# Patient Record
Sex: Male | Born: 1985 | Race: White | Hispanic: No | Marital: Single | Smoking: Never smoker
Health system: Southern US, Community
[De-identification: ages and names within clinical notes are randomized; demographics above are authoritative.]

## PROBLEM LIST (undated history)

## (undated) DIAGNOSIS — K219 Gastro-esophageal reflux disease without esophagitis: Secondary | ICD-10-CM

## (undated) DIAGNOSIS — R51 Headache: Secondary | ICD-10-CM

## (undated) DIAGNOSIS — F419 Anxiety disorder, unspecified: Secondary | ICD-10-CM

## (undated) DIAGNOSIS — Z9289 Personal history of other medical treatment: Secondary | ICD-10-CM

## (undated) DIAGNOSIS — F32A Depression, unspecified: Secondary | ICD-10-CM

## (undated) DIAGNOSIS — R519 Headache, unspecified: Secondary | ICD-10-CM

## (undated) DIAGNOSIS — S064X9A Epidural hemorrhage with loss of consciousness of unspecified duration, initial encounter: Secondary | ICD-10-CM

## (undated) DIAGNOSIS — S0219XA Other fracture of base of skull, initial encounter for closed fracture: Secondary | ICD-10-CM

## (undated) DIAGNOSIS — S81012A Laceration without foreign body, left knee, initial encounter: Secondary | ICD-10-CM

## (undated) DIAGNOSIS — R569 Unspecified convulsions: Secondary | ICD-10-CM

## (undated) DIAGNOSIS — F329 Major depressive disorder, single episode, unspecified: Secondary | ICD-10-CM

## (undated) HISTORY — DX: Depression, unspecified: F32.A

## (undated) HISTORY — DX: Unspecified convulsions: R56.9

## (undated) HISTORY — DX: Gastro-esophageal reflux disease without esophagitis: K21.9

## (undated) HISTORY — PX: WISDOM TOOTH EXTRACTION: SHX21

## (undated) HISTORY — DX: Anxiety disorder, unspecified: F41.9

## (undated) HISTORY — PX: OTHER SURGICAL HISTORY: SHX169

## (undated) HISTORY — PX: HERNIA REPAIR: SHX51

---
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
## 2006-10-25 ENCOUNTER — Encounter (INDEPENDENT_AMBULATORY_CARE_PROVIDER_SITE_OTHER): Payer: Self-pay | Admitting: General Surgery

## 2006-10-25 ENCOUNTER — Observation Stay (HOSPITAL_COMMUNITY): Admission: RE | Admit: 2006-10-25 | Discharge: 2006-10-26 | Payer: Self-pay | Admitting: General Surgery

## 2009-12-19 ENCOUNTER — Emergency Department (HOSPITAL_COMMUNITY): Admission: EM | Admit: 2009-12-19 | Discharge: 2009-12-19 | Payer: Self-pay | Admitting: Emergency Medicine

## 2009-12-21 ENCOUNTER — Ambulatory Visit (HOSPITAL_COMMUNITY): Admission: RE | Admit: 2009-12-21 | Discharge: 2009-12-21 | Payer: Self-pay | Admitting: Family Medicine

## 2010-01-01 ENCOUNTER — Emergency Department (HOSPITAL_COMMUNITY): Admission: EM | Admit: 2010-01-01 | Discharge: 2010-01-02 | Payer: Self-pay | Admitting: Emergency Medicine

## 2010-01-05 ENCOUNTER — Emergency Department (HOSPITAL_COMMUNITY): Admission: EM | Admit: 2010-01-05 | Discharge: 2010-01-05 | Payer: Self-pay | Admitting: Emergency Medicine

## 2010-02-11 ENCOUNTER — Emergency Department (HOSPITAL_COMMUNITY)
Admission: EM | Admit: 2010-02-11 | Discharge: 2010-02-11 | Payer: Self-pay | Source: Home / Self Care | Admitting: Emergency Medicine

## 2010-02-17 ENCOUNTER — Emergency Department (HOSPITAL_COMMUNITY): Admission: EM | Admit: 2010-02-17 | Discharge: 2010-02-17 | Payer: Self-pay | Admitting: Emergency Medicine

## 2010-02-21 ENCOUNTER — Emergency Department (HOSPITAL_COMMUNITY): Admission: EM | Admit: 2010-02-21 | Discharge: 2010-02-21 | Payer: Self-pay | Admitting: Emergency Medicine

## 2010-02-23 ENCOUNTER — Emergency Department (HOSPITAL_COMMUNITY): Admission: EM | Admit: 2010-02-23 | Discharge: 2010-02-23 | Payer: Self-pay | Admitting: Emergency Medicine

## 2010-03-17 ENCOUNTER — Emergency Department (HOSPITAL_COMMUNITY)
Admission: EM | Admit: 2010-03-17 | Discharge: 2010-03-17 | Payer: Self-pay | Source: Home / Self Care | Admitting: Emergency Medicine

## 2010-05-23 ENCOUNTER — Emergency Department (HOSPITAL_COMMUNITY)
Admission: EM | Admit: 2010-05-23 | Discharge: 2010-05-23 | Disposition: A | Discharge status: Home / Self Care | Payer: PRIVATE HEALTH INSURANCE | Attending: Emergency Medicine | Admitting: Emergency Medicine

## 2010-05-23 ENCOUNTER — Emergency Department (HOSPITAL_COMMUNITY): Payer: PRIVATE HEALTH INSURANCE

## 2010-05-23 DIAGNOSIS — M545 Low back pain, unspecified: Secondary | ICD-10-CM | POA: Insufficient documentation

## 2010-05-23 DIAGNOSIS — M25529 Pain in unspecified elbow: Secondary | ICD-10-CM | POA: Insufficient documentation

## 2010-05-23 DIAGNOSIS — IMO0002 Reserved for concepts with insufficient information to code with codable children: Secondary | ICD-10-CM | POA: Insufficient documentation

## 2010-06-04 ENCOUNTER — Emergency Department (HOSPITAL_COMMUNITY)
Admission: EM | Admit: 2010-06-04 | Discharge: 2010-06-04 | Disposition: A | Discharge status: Home / Self Care | Payer: PRIVATE HEALTH INSURANCE | Attending: Emergency Medicine | Admitting: Emergency Medicine

## 2010-06-04 DIAGNOSIS — IMO0002 Reserved for concepts with insufficient information to code with codable children: Secondary | ICD-10-CM | POA: Insufficient documentation

## 2010-06-04 DIAGNOSIS — M25529 Pain in unspecified elbow: Secondary | ICD-10-CM | POA: Insufficient documentation

## 2010-06-04 DIAGNOSIS — Y929 Unspecified place or not applicable: Secondary | ICD-10-CM | POA: Insufficient documentation

## 2010-06-04 DIAGNOSIS — X58XXXA Exposure to other specified factors, initial encounter: Secondary | ICD-10-CM | POA: Insufficient documentation

## 2010-06-08 LAB — URINALYSIS, ROUTINE W REFLEX MICROSCOPIC
Bilirubin Urine: NEGATIVE
Hgb urine dipstick: NEGATIVE
Nitrite: NEGATIVE
Specific Gravity, Urine: 1.025 (ref 1.005–1.030)
pH: 5.5 (ref 5.0–8.0)

## 2010-06-10 LAB — COMPREHENSIVE METABOLIC PANEL
ALT: 37 U/L (ref 0–53)
AST: 26 U/L (ref 0–37)
Alkaline Phosphatase: 55 U/L (ref 39–117)
CO2: 30 mEq/L (ref 19–32)
Chloride: 99 mEq/L (ref 96–112)
Creatinine, Ser: 0.88 mg/dL (ref 0.4–1.5)
GFR calc Af Amer: >60 mL/min (ref >60)
GFR calc non Af Amer: >60 mL/min (ref >60)
Potassium: 3.6 mEq/L (ref 3.5–5.1)
Total Bilirubin: 0.4 mg/dL (ref 0.3–1.2)

## 2010-06-10 LAB — DIFFERENTIAL
Basophils Absolute: 0.1 10*3/uL (ref 0.0–0.1)
Basophils Relative: 1 % (ref 0–1)
Eosinophils Absolute: 0.6 10*3/uL (ref 0.0–0.7)
Eosinophils Relative: 15 % — ABNORMAL HIGH (ref 0–5)

## 2010-06-10 LAB — CBC
Hemoglobin: 13.8 g/dL (ref 13.0–17.0)
MCH: 30.6 pg (ref 26.0–34.0)
RBC: 4.5 MIL/uL (ref 4.22–5.81)

## 2010-06-10 LAB — URINALYSIS, ROUTINE W REFLEX MICROSCOPIC
Bilirubin Urine: NEGATIVE
Ketones, ur: NEGATIVE mg/dL
Nitrite: NEGATIVE
Protein, ur: NEGATIVE mg/dL

## 2010-06-20 ENCOUNTER — Emergency Department (HOSPITAL_COMMUNITY): Payer: PRIVATE HEALTH INSURANCE

## 2010-06-20 ENCOUNTER — Emergency Department (HOSPITAL_COMMUNITY)
Admission: EM | Admit: 2010-06-20 | Discharge: 2010-06-20 | Disposition: A | Discharge status: Home / Self Care | Payer: PRIVATE HEALTH INSURANCE | Attending: Emergency Medicine | Admitting: Emergency Medicine

## 2010-06-20 DIAGNOSIS — S60219A Contusion of unspecified wrist, initial encounter: Secondary | ICD-10-CM | POA: Insufficient documentation

## 2010-06-20 DIAGNOSIS — W138XXA Fall from, out of or through other building or structure, initial encounter: Secondary | ICD-10-CM | POA: Insufficient documentation

## 2010-06-20 DIAGNOSIS — S5010XA Contusion of unspecified forearm, initial encounter: Secondary | ICD-10-CM | POA: Insufficient documentation

## 2010-06-20 DIAGNOSIS — Y92009 Unspecified place in unspecified non-institutional (private) residence as the place of occurrence of the external cause: Secondary | ICD-10-CM | POA: Insufficient documentation

## 2010-06-25 ENCOUNTER — Emergency Department (HOSPITAL_COMMUNITY)
Admission: EM | Admit: 2010-06-25 | Discharge: 2010-06-25 | Disposition: A | Discharge status: Home / Self Care | Payer: PRIVATE HEALTH INSURANCE | Attending: Emergency Medicine | Admitting: Emergency Medicine

## 2010-06-25 DIAGNOSIS — M79609 Pain in unspecified limb: Secondary | ICD-10-CM | POA: Insufficient documentation

## 2010-06-25 DIAGNOSIS — Z09 Encounter for follow-up examination after completed treatment for conditions other than malignant neoplasm: Secondary | ICD-10-CM | POA: Insufficient documentation

## 2010-06-29 ENCOUNTER — Emergency Department (HOSPITAL_COMMUNITY): Payer: PRIVATE HEALTH INSURANCE

## 2010-06-29 ENCOUNTER — Emergency Department (HOSPITAL_COMMUNITY)
Admission: EM | Admit: 2010-06-29 | Discharge: 2010-06-29 | Disposition: A | Discharge status: Home / Self Care | Payer: PRIVATE HEALTH INSURANCE | Attending: Emergency Medicine | Admitting: Emergency Medicine

## 2010-06-29 DIAGNOSIS — Y92009 Unspecified place in unspecified non-institutional (private) residence as the place of occurrence of the external cause: Secondary | ICD-10-CM | POA: Insufficient documentation

## 2010-06-29 DIAGNOSIS — X500XXA Overexertion from strenuous movement or load, initial encounter: Secondary | ICD-10-CM | POA: Insufficient documentation

## 2010-06-29 DIAGNOSIS — M79609 Pain in unspecified limb: Secondary | ICD-10-CM | POA: Insufficient documentation

## 2010-08-10 NOTE — Op Note (Signed)
Marcus Mora, Marcus Mora              ACCOUNT NO.:  [id number]   MEDICAL RECORD NO.:  [id number]          PATIENT TYPE:  OBV   LOCATION:  A309                          FACILITY:  [facility]   PHYSICIAN:  Barbaraann Barthel, M.D. DATE OF BIRTH:  06/19/1985   DATE OF PROCEDURE:  10/25/2006  DATE OF DISCHARGE:                               OPERATIVE REPORT   SURGEON:  Barbaraann Barthel, M.D.   PREOPERATIVE DIAGNOSIS:  Left inguinal hernia (direct and indirect).   POSTOPERATIVE DIAGNOSIS:  Left inguinal hernia (direct and indirect).   PROCEDURE:  Left left inguinal hernia repair (modified McVay).   SPECIMEN:  Hernia sac and properitoneal lipomatous tissue.   This is a 20-year white male who had a left inguinal hernia,  nonincarcerated, with plans for an elective procedure.  We discussed  repair with him, discussed the possibility that mesh might be placed,  and discussed other complications including bleeding, infection and  recurrence.  Informed consent was obtained.   GROSS OPERATIVE FINDINGS:  Indirect hernia sac and direct defect.  Otherwise, the tissues appeared to be in good shape and I did not feel  that the use of mesh was needed.   TECHNIQUE:  The patient was placed in the supine position after the  adequate administration of general anesthesia.  Foley catheter prior was  aseptically inserted.  We prepped the groin area with Betadine solution  and draped in the usual manner.  An incision was carried out between the  anterior-superior iliac spine and the pubic tubercle through skin,  subcutaneous tissue, down through the Scarpa's layer and down through  the external oblique which we opened through the external ring.  The  cord structures were then dissected free from the hernia sac which was  ligated and divided with 2-0 Bralon. We then removed also small  lipomatous properitoneal fatty tissue around the proximal portion of the  cord structures.  We then sutured transversus abdominis  and  transversalis fascia to Cooper's ligament and Poupart's ligament using  interrupted Bralon sutures.  We cinched these tightly after performing a  relaxing incision.  After checking for hemostasis, we anesthetized the  area with approximately 6 or 7 mL of 0.5% Sensorcaine without  epinephrine and then returned the cord structures to their anatomic  position and closed the external oblique over it with a running 3-0  Polysorb suture.  Subcu was irrigated.  The skin was approximated with a  stapling device.  Sterile dressing was applied.  Prior to closure all  sponge, needle and instrument counts were found to be correct.  Estimated blood loss was minimal.  The patient tolerated the procedure  well and was taken to the recovery room in satisfactory condition.      Barbaraann Barthel, M.D.  Electronically Signed     WB/MEDQ  D:  10/25/2006  T:  10/26/2006  Job:  [id number]   cc:   Madelin Rear. Sherwood Gambler, MD  Fax: [phone]

## 2010-10-03 ENCOUNTER — Encounter: Payer: Self-pay | Admitting: *Deleted

## 2010-10-03 ENCOUNTER — Emergency Department (HOSPITAL_COMMUNITY): Payer: Self-pay

## 2010-10-03 ENCOUNTER — Emergency Department (HOSPITAL_COMMUNITY)
Admission: EM | Admit: 2010-10-03 | Discharge: 2010-10-03 | Disposition: A | Discharge status: Home / Self Care | Payer: Self-pay | Attending: Emergency Medicine | Admitting: Emergency Medicine

## 2010-10-03 DIAGNOSIS — S5012XA Contusion of left forearm, initial encounter: Secondary | ICD-10-CM

## 2010-10-03 DIAGNOSIS — S5010XA Contusion of unspecified forearm, initial encounter: Secondary | ICD-10-CM | POA: Insufficient documentation

## 2010-10-03 DIAGNOSIS — IMO0002 Reserved for concepts with insufficient information to code with codable children: Secondary | ICD-10-CM | POA: Insufficient documentation

## 2010-10-03 DIAGNOSIS — S60222A Contusion of left hand, initial encounter: Secondary | ICD-10-CM

## 2010-10-03 DIAGNOSIS — J45909 Unspecified asthma, uncomplicated: Secondary | ICD-10-CM | POA: Insufficient documentation

## 2010-10-03 DIAGNOSIS — S60229A Contusion of unspecified hand, initial encounter: Secondary | ICD-10-CM | POA: Insufficient documentation

## 2010-10-03 MED ORDER — IBUPROFEN 800 MG PO TABS
800.0000 mg | ORAL_TABLET | Freq: Once | ORAL | Status: AC
  Administered 2010-10-03: 800 mg via ORAL
  Filled 2010-10-03: qty 1

## 2010-10-03 MED ORDER — HYDROCODONE-ACETAMINOPHEN 5-325 MG PO TABS: 2.0000 | ORAL_TABLET | ORAL | Status: AC | PRN

## 2010-10-03 NOTE — ED Provider Notes (Signed)
History     Chief Complaint  Patient presents with  . Hand Injury  . Arm Injury   Patient is a 25 y.o. male presenting with arm injury. The history is provided by the patient.  Arm Injury  The incident occurred yesterday. Injury mechanism: He was working on a car when a heavy piece of metal rolled onto his left forearm. He came to the ER via personal transport. The pain is severe.  Pain is worse with palpation and with movement. Nothing makes it better.  Past Medical History  Diagnosis Date  . Asthma     Past Surgical History  Procedure Date  . Hernia repair     History reviewed. No pertinent family history.  History  Substance Use Topics  . Smoking status: Never Smoker   . Smokeless tobacco: Not on file  . Alcohol Use: No      Review of Systems  All other systems reviewed and are negative.    Physical Exam  BP 139/85  Pulse 78  Temp(Src) 98 F (36.7 C) (Oral)  Resp 16  Ht 5\' 11"  (1.803 m)  Wt 150 lb (68.04 kg)  BMI 20.92 kg/m2  SpO2 100%  Physical Exam  Constitutional: He is oriented to person, place, and time. He appears well-developed and well-nourished.  HENT:  Head: Normocephalic and atraumatic.  Right Ear: External ear normal.  Left Ear: External ear normal.  Mouth/Throat: Oropharynx is clear and moist.  Eyes: Conjunctivae and EOM are normal. Pupils are equal, round, and reactive to light.  Neck: Normal range of motion. Neck supple. JVD present.  Cardiovascular: Normal rate, regular rhythm and normal heart sounds.   No murmur heard. Pulmonary/Chest: Effort normal and breath sounds normal. He has no wheezes. He has no rales.  Abdominal: Soft. Bowel sounds are normal. He exhibits no mass. There is no tenderness.  Musculoskeletal: Normal range of motion. He exhibits no edema.  Lymphadenopathy:    He has no cervical adenopathy.  Neurological: He is alert and oriented to person, place, and time. He has normal reflexes. A cranial nerve deficit is  present. Coordination abnormal.  Skin: Skin is warm and dry. No rash noted.  Psychiatric: He has a normal mood and affect.    ED Course  ORTHOPEDIC INJURY TREATMENT Date/Time: 10/03/2010 11:09 AM Performed by: Dione Booze Authorized by: Preston Fleeting, Benett Swoyer Consent: Verbal consent obtained. Written consent not obtained. Risks and benefits: risks, benefits and alternatives were discussed Patient understanding: patient states understanding of the procedure being performed Patient consent: the patient's understanding of the procedure matches consent given Procedure consent: procedure consent matches procedure scheduled Test results: test results available and properly labeled Site marked: the operative site was not marked Imaging studies: imaging studies available Injury location: wrist Location details: left wrist Injury type: soft tissue Pre-procedure neurovascular assessment: neurovascularly intact Pre-procedure distal perfusion: normal Pre-procedure neurological function: normal Pre-procedure range of motion: normal Local anesthesia used: no Patient sedated: no Immobilization: splint Splint type: velcro wrist splint. Post-procedure neurovascular assessment: post-procedure neurovascularly intact Post-procedure distal perfusion: normal Post-procedure neurological function: normal Post-procedure range of motion: unchanged Patient tolerance: Patient tolerated the procedure well with no immediate complications.    MDM Soft tissue injury to left forearm, wrist and hand.      Dione Booze, MD 10/03/10 [phone]

## 2010-10-03 NOTE — ED Notes (Signed)
Transmission of car rolled onto left hand/arm yesterday.  Swelling/deformity noted in triage.  C/o pain to hand/forearm.

## 2010-10-13 ENCOUNTER — Emergency Department (HOSPITAL_COMMUNITY): Payer: Self-pay

## 2010-10-13 ENCOUNTER — Emergency Department (HOSPITAL_COMMUNITY)
Admission: EM | Admit: 2010-10-13 | Discharge: 2010-10-13 | Disposition: A | Discharge status: Home / Self Care | Payer: Self-pay | Attending: Emergency Medicine | Admitting: Emergency Medicine

## 2010-10-13 ENCOUNTER — Encounter (HOSPITAL_COMMUNITY): Payer: Self-pay | Admitting: *Deleted

## 2010-10-13 DIAGNOSIS — W19XXXA Unspecified fall, initial encounter: Secondary | ICD-10-CM | POA: Insufficient documentation

## 2010-10-13 DIAGNOSIS — S63509A Unspecified sprain of unspecified wrist, initial encounter: Secondary | ICD-10-CM | POA: Insufficient documentation

## 2010-10-13 DIAGNOSIS — M25539 Pain in unspecified wrist: Secondary | ICD-10-CM | POA: Insufficient documentation

## 2010-10-13 MED ORDER — HYDROCODONE-ACETAMINOPHEN 5-325 MG PO TABS: 1.0000 | ORAL_TABLET | ORAL | Status: DC | PRN

## 2010-10-13 MED ORDER — IBUPROFEN 800 MG PO TABS: 800.0000 mg | ORAL_TABLET | Freq: Three times a day (TID) | ORAL | Status: AC

## 2010-10-13 NOTE — ED Notes (Signed)
Pt states that he tripped and fell yesterday and caught himself with his left wrist. C/o pain since then. Strong radial pulse palpated. Left hand pink and warm. Able to wiggle fingers without difficulty. No deformity noted. Pt has on velcro wrist splint from previous injury.

## 2010-10-13 NOTE — ED Provider Notes (Addendum)
History     Chief Complaint  Patient presents with  . Wrist Pain   Patient is a 25 y.o. male presenting with wrist pain. The history is provided by the patient.  Wrist Pain This is a new (Patient fell yeterday,  landing on left outstretched hand.) problem. The current episode started yesterday. The problem occurs constantly. The problem has been unchanged. Associated symptoms include arthralgias. Pertinent negatives include no abdominal pain, chest pain, congestion, fever, headaches, joint swelling, nausea, neck pain, numbness, rash, sore throat or weakness. Exacerbated by: movement. He has tried acetaminophen (wrist splint and ice from injury to same wrist 10 days ago) for the symptoms. The treatment provided mild relief.  Wrist Pain This is a new (Patient fell yeterday,  landing on left outstretched hand.) problem. The current episode started yesterday. The problem occurs constantly. The problem has been unchanged. Pertinent negatives include no chest pain, no abdominal pain, no headaches and no shortness of breath. Exacerbated by: movement. He has tried acetaminophen (wrist splint and ice from injury to same wrist 10 days ago) for the symptoms. The treatment provided mild relief.    Past Medical History  Diagnosis Date  . Asthma     Past Surgical History  Procedure Date  . Hernia repair     Family History  Problem Relation Age of Onset  . Thyroid disease Mother     History  Substance Use Topics  . Smoking status: Never Smoker   . Smokeless tobacco: Not on file  . Alcohol Use: No      Review of Systems  Constitutional: Negative for fever.  HENT: Negative for congestion, sore throat and neck pain.   Eyes: Negative.   Respiratory: Negative for chest tightness and shortness of breath.   Cardiovascular: Negative for chest pain.  Gastrointestinal: Negative for nausea and abdominal pain.  Genitourinary: Negative.   Musculoskeletal: Positive for arthralgias. Negative for  joint swelling.  Skin: Positive for color change. Negative for rash and wound.  Neurological: Negative for dizziness, weakness, light-headedness, numbness and headaches.  Hematological: Negative.   Psychiatric/Behavioral: Negative.     Physical Exam  BP 119/81  Pulse 62  Temp(Src) 97.8 F (36.6 C) (Oral)  Resp 16  Ht 5\' 11"  (1.803 m)  Wt 150 lb (68.04 kg)  BMI 20.92 kg/m2  SpO2 100%  Physical Exam  Vitals reviewed. Constitutional: He is oriented to person, place, and time. He appears well-developed and well-nourished.  HENT:  Head: Normocephalic and atraumatic.  Eyes: Conjunctivae are normal.  Neck: Normal range of motion.  Cardiovascular: Normal rate and regular rhythm.   Pulmonary/Chest: Effort normal.  Musculoskeletal: He exhibits tenderness.       Left wrist: He exhibits decreased range of motion and bony tenderness. He exhibits no deformity and no laceration.  Neurological: He is alert and oriented to person, place, and time. He has normal strength. No sensory deficit.  Skin: Skin is warm and dry.     Psychiatric: He has a normal mood and affect.    ED Course  Procedures  MDM Left wrist sprain Medical screening examination/treatment/procedure(s) were performed by non-physician practitioner and as supervising physician I was immediately available for consultation/collaboration.      Candis Musa, PA 10/13/10 1017  Benny Lennert, MD 11/17/10 [phone]

## 2010-10-15 ENCOUNTER — Encounter (HOSPITAL_COMMUNITY): Payer: Self-pay

## 2010-10-15 ENCOUNTER — Emergency Department (HOSPITAL_COMMUNITY): Payer: Self-pay

## 2010-10-15 ENCOUNTER — Emergency Department (HOSPITAL_COMMUNITY)
Admission: EM | Admit: 2010-10-15 | Discharge: 2010-10-15 | Disposition: A | Discharge status: Home / Self Care | Payer: Self-pay | Attending: Emergency Medicine | Admitting: Emergency Medicine

## 2010-10-15 DIAGNOSIS — J45909 Unspecified asthma, uncomplicated: Secondary | ICD-10-CM | POA: Insufficient documentation

## 2010-10-15 DIAGNOSIS — S81009A Unspecified open wound, unspecified knee, initial encounter: Secondary | ICD-10-CM | POA: Insufficient documentation

## 2010-10-15 DIAGNOSIS — S81819A Laceration without foreign body, unspecified lower leg, initial encounter: Secondary | ICD-10-CM

## 2010-10-15 DIAGNOSIS — S81809A Unspecified open wound, unspecified lower leg, initial encounter: Secondary | ICD-10-CM | POA: Insufficient documentation

## 2010-10-15 DIAGNOSIS — W268XXA Contact with other sharp object(s), not elsewhere classified, initial encounter: Secondary | ICD-10-CM | POA: Insufficient documentation

## 2010-10-15 MED ORDER — OXYCODONE HCL 5 MG PO TABS
5.0000 mg | ORAL_TABLET | Freq: Once | ORAL | Status: AC
  Administered 2010-10-15: 5 mg via ORAL
  Filled 2010-10-15: qty 1

## 2010-10-15 MED ORDER — IBUPROFEN 800 MG PO TABS: 800.0000 mg | ORAL_TABLET | Freq: Three times a day (TID) | ORAL | Status: AC

## 2010-10-15 MED ORDER — OXYCODONE-ACETAMINOPHEN 5-325 MG PO TABS: 1.0000 | ORAL_TABLET | Freq: Once | ORAL | Status: DC

## 2010-10-15 MED ORDER — DIPHTHERIA-TETANUS TOXOIDS 6.7-5 LFU/0.5ML IM INJ: 0.5000 mL | INJECTION | Freq: Once | INTRAMUSCULAR | Status: DC

## 2010-10-15 MED ORDER — OXYCODONE HCL 5 MG PO TABS: 5.0000 mg | ORAL_TABLET | ORAL | Status: AC | PRN

## 2010-10-15 MED ORDER — LIDOCAINE-EPINEPHRINE 2 %-1:100000 IJ SOLN: 20.0000 mL | Freq: Once | INTRAMUSCULAR | Status: DC

## 2010-10-15 MED ORDER — TETANUS-DIPHTH-ACELL PERTUSSIS 5-2.5-18.5 LF-MCG/0.5 IM SUSP
0.5000 mL | Freq: Once | INTRAMUSCULAR | Status: AC
  Administered 2010-10-15: 0.5 mL via INTRAMUSCULAR
  Filled 2010-10-15: qty 0.5

## 2010-10-15 MED ORDER — HYDROCODONE-ACETAMINOPHEN 5-325 MG PO TABS: 1.0000 | ORAL_TABLET | ORAL | Status: AC | PRN

## 2010-10-15 NOTE — ED Notes (Signed)
Pt to xray

## 2010-10-15 NOTE — ED Notes (Signed)
Pt was carrying a piece of glass today and it slipped from his hands.  The glass fell onto his left shin.  Pt has a laceration to his left shin.  Bleeding is controlled.  nad noted

## 2010-10-15 NOTE — ED Notes (Signed)
Pt presents with laceration to right shin. Pt state piece of glass fell and cut leg open approx 3 inches.

## 2010-10-15 NOTE — ED Provider Notes (Signed)
History     Chief Complaint  Patient presents with  . Extremity Laceration   Patient is a 25 y.o. male presenting with skin laceration. The history is provided by the patient.  Laceration  The incident occurred 1 to 2 hours ago. The laceration is located on the right leg. The laceration is 5 cm in size. The laceration mechanism was a broken glass. The pain is at a severity of 4/10. The pain is moderate. The pain has been constant since onset. He reports no foreign bodies present. His tetanus status is out of date.    Past Medical History  Diagnosis Date  . Asthma     Past Surgical History  Procedure Date  . Hernia repair   . Hernia     Family History  Problem Relation Age of Onset  . Thyroid disease Mother     History  Substance Use Topics  . Smoking status: Never Smoker   . Smokeless tobacco: Not on file  . Alcohol Use: No      Review of Systems  Constitutional: Negative for fever.  HENT: Negative for congestion, sore throat and neck pain.   Eyes: Negative.   Respiratory: Negative for chest tightness and shortness of breath.   Cardiovascular: Negative for chest pain.  Gastrointestinal: Negative for nausea and abdominal pain.  Genitourinary: Negative.   Musculoskeletal: Negative for joint swelling and arthralgias.  Skin: Negative for rash and wound.       Otherwise negative.  Neurological: Negative for dizziness, weakness, light-headedness, numbness and headaches.  Hematological: Negative.   Psychiatric/Behavioral: Negative.     Physical Exam  BP 125/110  Pulse 94  Temp(Src) 98 F (36.7 C) (Oral)  Resp 24  Ht 5\' 11"  (1.803 m)  Wt 150 lb (68.04 kg)  BMI 20.92 kg/m2  SpO2 99%  Physical Exam  Vitals reviewed. Constitutional: He is oriented to person, place, and time. He appears well-developed and well-nourished.  HENT:  Head: Normocephalic and atraumatic.  Eyes: Conjunctivae are normal.  Neck: Normal range of motion.  Cardiovascular: Normal rate,  regular rhythm, normal heart sounds and intact distal pulses.   Pulmonary/Chest: Effort normal and breath sounds normal. He has no wheezes.  Abdominal: Soft. Bowel sounds are normal. There is no tenderness.  Musculoskeletal: Normal range of motion.  Neurological: He is alert and oriented to person, place, and time.  Skin: Skin is warm and dry.     Psychiatric: He has a normal mood and affect.    ED Course  LACERATION REPAIR Date/Time: 10/15/2010 4:01 PM Performed by: Javonnie Illescas L Authorized by: Benny Lennert Consent: Verbal consent obtained. Risks and benefits: risks, benefits and alternatives were discussed Consent given by: patient Patient understanding: patient states understanding of the procedure being performed Body area: lower extremity Location details: right lower leg Laceration length: 5 cm Foreign bodies: no foreign bodies Tendon involvement: none Nerve involvement: none Anesthesia: local infiltration Local anesthetic: lidocaine 1% with epinephrine Anesthetic total: 6 ml Patient sedated: no Irrigation method: syringe Amount of cleaning: extensive Debridement: none Skin closure: 4-0 nylon Number of sutures: 10 Technique: simple Approximation: close Approximation difficulty: simple Patient tolerance: Patient tolerated the procedure well with no immediate complications.    MDM       Candis Musa, PA 10/15/10 [phone]

## 2010-10-17 ENCOUNTER — Encounter (HOSPITAL_COMMUNITY): Payer: Self-pay

## 2010-10-17 ENCOUNTER — Emergency Department (HOSPITAL_COMMUNITY)
Admission: EM | Admit: 2010-10-17 | Discharge: 2010-10-17 | Disposition: A | Discharge status: Home / Self Care | Payer: Self-pay | Attending: Emergency Medicine | Admitting: Emergency Medicine

## 2010-10-17 DIAGNOSIS — S81009A Unspecified open wound, unspecified knee, initial encounter: Secondary | ICD-10-CM | POA: Insufficient documentation

## 2010-10-17 DIAGNOSIS — X58XXXA Exposure to other specified factors, initial encounter: Secondary | ICD-10-CM | POA: Insufficient documentation

## 2010-10-17 DIAGNOSIS — S81819A Laceration without foreign body, unspecified lower leg, initial encounter: Secondary | ICD-10-CM

## 2010-10-17 DIAGNOSIS — J45909 Unspecified asthma, uncomplicated: Secondary | ICD-10-CM | POA: Insufficient documentation

## 2010-10-17 MED ORDER — OXYCODONE HCL 5 MG PO TABS: ORAL_TABLET | ORAL | Status: DC

## 2010-10-17 NOTE — ED Provider Notes (Signed)
History     Chief Complaint  Patient presents with  . Wound Dehiscence   The history is provided by the patient (pt had R lower leg lac repaired 2 days ago.  awakened today and 3 central sutures are gone.  ). No language interpreter was used.    Past Medical History  Diagnosis Date  . Asthma     Past Surgical History  Procedure Date  . Hernia repair   . Hernia     Family History  Problem Relation Age of Onset  . Thyroid disease Mother     History  Substance Use Topics  . Smoking status: Never Smoker   . Smokeless tobacco: Not on file  . Alcohol Use: No      Review of Systems  Skin: Positive for wound.  All other systems reviewed and are negative.    Physical Exam  BP 137/62  Pulse 61  Temp(Src) 98.1 F (36.7 C) (Oral)  Resp 16  SpO2 100%  Physical Exam  Nursing note and vitals reviewed. Constitutional: He is oriented to person, place, and time. Vital signs are normal. He appears well-developed and well-nourished.  HENT:  Head: Normocephalic and atraumatic.  Right Ear: External ear normal.  Left Ear: External ear normal.  Nose: Nose normal.  Mouth/Throat: No oropharyngeal exudate.  Eyes: Conjunctivae and EOM are normal. Pupils are equal, round, and reactive to light. Right eye exhibits no discharge. Left eye exhibits no discharge. No scleral icterus.  Neck: Normal range of motion. Neck supple. No JVD present. No tracheal deviation present. No thyromegaly present.  Cardiovascular: Normal rate, regular rhythm, normal heart sounds, intact distal pulses and normal pulses.  Exam reveals no gallop and no friction rub.   No murmur heard. Pulmonary/Chest: Effort normal and breath sounds normal. No stridor. No respiratory distress. He has no wheezes. He has no rales. He exhibits no tenderness.  Abdominal: Soft. Normal appearance and bowel sounds are normal. He exhibits no distension and no mass. There is no tenderness. There is no rebound and no guarding.    Musculoskeletal: Normal range of motion. He exhibits no edema and no tenderness.       Legs: Lymphadenopathy:    He has no cervical adenopathy.  Neurological: He is alert and oriented to person, place, and time. He has normal reflexes. No cranial nerve deficit. Coordination normal. GCS eye subscore is 4. GCS verbal subscore is 5. GCS motor subscore is 6.  Reflex Scores:      Tricep reflexes are 2+ on the right side and 2+ on the left side.      Bicep reflexes are 2+ on the right side and 2+ on the left side.      Brachioradialis reflexes are 2+ on the right side and 2+ on the left side.      Patellar reflexes are 2+ on the right side and 2+ on the left side.      Achilles reflexes are 2+ on the right side and 2+ on the left side. Skin: Skin is warm and dry. No rash noted. He is not diaphoretic.       laceration  Psychiatric: He has a normal mood and affect. His speech is normal and behavior is normal. Judgment and thought content normal. Cognition and memory are normal.    ED Course  Procedures  MDM       Worthy Rancher, PA 10/17/10 1303

## 2010-10-17 NOTE — ED Notes (Signed)
Pt a/ox4. Resp even and unlabored. NAD at this time. D/c instructions reviewed with pt. Pt verbalized understanding. Pt escorted to d/c desk. Pt ambulated with steady gate. 

## 2010-10-17 NOTE — ED Notes (Signed)
Pt states 3 of his stitches came out of his right lower leg

## 2010-11-17 NOTE — ED Provider Notes (Signed)
Medical screening examination/treatment/procedure(s) were performed by non-physician practitioner and as supervising physician I was immediately available for consultation/collaboration.   Benny Lennert, MD 11/17/10 [phone]

## 2010-11-17 NOTE — ED Provider Notes (Signed)
Medical screening examination/treatment/procedure(s) were performed by non-physician practitioner and as supervising physician I was immediately available for consultation/collaboration.   Benny Lennert, MD 11/17/10 [phone]

## 2010-11-18 NOTE — ED Provider Notes (Signed)
Medical screening examination/treatment/procedure(s) were performed by non-physician practitioner and as supervising physician I was immediately available for consultation/collaboration.   Benny Lennert, MD 11/18/10 [phone]

## 2010-12-15 ENCOUNTER — Emergency Department (HOSPITAL_COMMUNITY): Payer: Self-pay

## 2010-12-15 ENCOUNTER — Ambulatory Visit (HOSPITAL_COMMUNITY): Payer: Self-pay

## 2010-12-15 ENCOUNTER — Encounter (HOSPITAL_COMMUNITY): Payer: Self-pay | Admitting: *Deleted

## 2010-12-15 ENCOUNTER — Emergency Department (HOSPITAL_COMMUNITY)
Admission: EM | Admit: 2010-12-15 | Discharge: 2010-12-15 | Disposition: A | Discharge status: Home / Self Care | Payer: Self-pay | Attending: Emergency Medicine | Admitting: Emergency Medicine

## 2010-12-15 DIAGNOSIS — M25569 Pain in unspecified knee: Secondary | ICD-10-CM | POA: Insufficient documentation

## 2010-12-15 DIAGNOSIS — M545 Low back pain, unspecified: Secondary | ICD-10-CM | POA: Insufficient documentation

## 2010-12-15 DIAGNOSIS — J45909 Unspecified asthma, uncomplicated: Secondary | ICD-10-CM | POA: Insufficient documentation

## 2010-12-15 DIAGNOSIS — Z9181 History of falling: Secondary | ICD-10-CM

## 2010-12-15 MED ORDER — IBUPROFEN 800 MG PO TABS
800.0000 mg | ORAL_TABLET | Freq: Once | ORAL | Status: AC
  Administered 2010-12-15: 800 mg via ORAL
  Filled 2010-12-15: qty 1

## 2010-12-15 NOTE — ED Provider Notes (Addendum)
History     CSN: [id number] Arrival date & time: 12/15/2010  3:56 AM   Chief Complaint  Patient presents with  . Back Pain  . Knee Pain     (Include location/radiation/quality/duration/timing/severity/associated sxs/prior treatment) Patient is a 25 y.o. male presenting with fall. The history is provided by the patient.  Fall Incident onset: Patient fell off a ladder on 11/15/10, hitting his  right knee on a rung as he fell. Was seen by PCP at the time and given pain medicine. Continues to have both lower back and right knee pain. The fall occurred from a ladder (11/15/10. Patient fell from 4 th rung from the bottom.). He fell from a height of 6 to 10 ft. He landed on dirt. There was no blood loss. Point of impact: back. Pain location: lower back and right knee. The pain is at a severity of 6/10. The pain is moderate. He was ambulatory at the scene. There was no entrapment after the fall. There was no drug use involved in the accident. There was no alcohol use involved in the accident. Associated symptoms include visual change. Pertinent negatives include no numbness, no nausea, no headaches and no loss of consciousness. Treatments tried: given "pain medicine" by PCP. The treatment provided no relief.     Past Medical History  Diagnosis Date  . Asthma      Past Surgical History  Procedure Date  . Hernia repair   . Hernia     Family History  Problem Relation Age of Onset  . Thyroid disease Mother     History  Substance Use Topics  . Smoking status: Never Smoker   . Smokeless tobacco: Not on file  . Alcohol Use: No      Review of Systems  Gastrointestinal: Negative for nausea.  Neurological: Negative for loss of consciousness, numbness and headaches.  All other systems reviewed and are negative.    Allergies  Tylenol  Home Medications   Current Outpatient Rx  Name Route Sig Dispense Refill  . OXYCODONE HCL 5 MG PO TABS  One po  q 4-6 hrs prn pain 12 tablet 0     Physical Exam    BP 134/90  Pulse 102  Temp 97.4 F (36.3 C)  Resp 18  Ht 5\' 10"  (1.778 m)  Wt 145 lb (65.772 kg)  BMI 20.81 kg/m2  SpO2 100%  Physical Exam  Nursing note and vitals reviewed. Constitutional: He is oriented to person, place, and time. He appears well-developed and well-nourished.  HENT:  Head: Normocephalic and atraumatic.  Eyes: EOM are normal. Pupils are equal, round, and reactive to light.  Neck: Normal range of motion. Neck supple.  Cardiovascular: Normal rate, normal heart sounds and intact distal pulses.   Pulmonary/Chest: Effort normal and breath sounds normal.  Abdominal: Soft. There is no tenderness. There is no rebound and no guarding.  Musculoskeletal: Normal range of motion.       R knee with no swelling, crepitus, bruising. FROM, normal strength. Lower back with no pain with percussion to spine. Paraspinal muscle tenderness LS spine.  Neurological: He is alert and oriented to person, place, and time.  Skin: Skin is warm.    ED Course  Procedures  Patient s/p fall over a month ago (11/15/10) with continued back pain and knee pain. Given NSAID. Had ordered LS spine films which patient decided he did not want.Pt stable in ED with no significant deterioration in condition. Nicoletta Dress. Colon Branch, MD 12/15/10 [id number] MDM Reviewed:  nursing note and vitals     Nicoletta Dress. Colon Branch, MD 12/15/10 [phone]

## 2010-12-15 NOTE — ED Notes (Signed)
Pt states he fell off the 4th rung of a ladder on the 21st of august. Complaining of back & right knee pain. Was seen by pcp & given meds & instructions to take it easy. Pt advises no improvement.

## 2010-12-15 NOTE — ED Notes (Signed)
Patient refused xray. Stated he did not have insurance

## 2011-01-10 LAB — COMPREHENSIVE METABOLIC PANEL
ALT: 11
AST: 16
CO2: 29
Chloride: 106
GFR calc Af Amer: >60
GFR calc non Af Amer: >60
Sodium: 139
Total Bilirubin: 0.8

## 2011-01-10 LAB — CBC
RBC: 4.27
WBC: 4.2

## 2011-05-03 ENCOUNTER — Emergency Department (HOSPITAL_COMMUNITY)
Admission: EM | Admit: 2011-05-03 | Discharge: 2011-05-03 | Disposition: A | Discharge status: Home / Self Care | Payer: No Typology Code available for payment source | Attending: Emergency Medicine | Admitting: Emergency Medicine

## 2011-05-03 ENCOUNTER — Emergency Department (HOSPITAL_COMMUNITY): Payer: No Typology Code available for payment source

## 2011-05-03 ENCOUNTER — Encounter (HOSPITAL_COMMUNITY): Payer: Self-pay

## 2011-05-03 DIAGNOSIS — S9032XA Contusion of left foot, initial encounter: Secondary | ICD-10-CM

## 2011-05-03 DIAGNOSIS — J45909 Unspecified asthma, uncomplicated: Secondary | ICD-10-CM | POA: Insufficient documentation

## 2011-05-03 DIAGNOSIS — M7989 Other specified soft tissue disorders: Secondary | ICD-10-CM | POA: Insufficient documentation

## 2011-05-03 DIAGNOSIS — M79609 Pain in unspecified limb: Secondary | ICD-10-CM | POA: Insufficient documentation

## 2011-05-03 MED ORDER — OXYCODONE-ACETAMINOPHEN 5-325 MG PO TABS
1.0000 | ORAL_TABLET | Freq: Once | ORAL | Status: AC
  Administered 2011-05-03: 1 via ORAL
  Filled 2011-05-03: qty 1

## 2011-05-03 MED ORDER — OXYCODONE-ACETAMINOPHEN 5-325 MG PO TABS: ORAL_TABLET | ORAL | Status: DC

## 2011-05-03 NOTE — ED Notes (Signed)
Ice pack given, instructed to apply in increments

## 2011-05-03 NOTE — ED Notes (Signed)
Pt was moving a wooden filing cabinet this morning and it fell on pt's left foot. Top of foot swollen and bruised.  Foot warm to touch, pt says can wiggle toes with shoe on but not without.   Pedal pulse present.

## 2011-05-03 NOTE — ED Notes (Signed)
Pt c/o left foot pain,

## 2011-05-03 NOTE — ED Provider Notes (Signed)
History     CSN: [id number]  Arrival date & time 05/03/11  1731   First MD Initiated Contact with Patient 05/03/11 1759      Chief Complaint  Patient presents with  . Foot Pain    (Consider location/radiation/quality/duration/timing/severity/associated sxs/prior treatment) HPI Comments: Moving a file cabinet this AM and dropped it on his L foot.  Patient is a 26 y.o. male presenting with lower extremity pain. The history is provided by the patient. No language interpreter was used.  Foot Pain This is a new problem. The current episode started today. The problem occurs constantly. The problem has been gradually worsening. The symptoms are aggravated by walking and standing. He has tried nothing for the symptoms.    Past Medical History  Diagnosis Date  . Asthma     Past Surgical History  Procedure Date  . Hernia repair   . Hernia     Family History  Problem Relation Age of Onset  . Thyroid disease Mother     History  Substance Use Topics  . Smoking status: Never Smoker   . Smokeless tobacco: Not on file  . Alcohol Use: No      Review of Systems  Musculoskeletal:       Foot injury   All other systems reviewed and are negative.    Allergies  Tylenol  Home Medications   Current Outpatient Rx  Name Route Sig Dispense Refill  . OXYCODONE HCL 5 MG PO TABS  One po  q 4-6 hrs prn pain 12 tablet 0    BP 134/85  Pulse 96  Temp(Src) 97.6 F (36.4 C) (Oral)  Resp 18  Ht 5\' 11"  (1.803 m)  Wt 150 lb (68.04 kg)  BMI 20.92 kg/m2  SpO2 98%  Physical Exam  Nursing note and vitals reviewed. Constitutional: He is oriented to person, place, and time. He appears well-developed and well-nourished.  HENT:  Head: Normocephalic and atraumatic.  Eyes: EOM are normal.  Neck: Normal range of motion.  Cardiovascular: Normal rate, regular rhythm, normal heart sounds and intact distal pulses.   Pulmonary/Chest: Effort normal and breath sounds normal. No respiratory  distress.  Abdominal: Soft. He exhibits no distension. There is no tenderness.  Musculoskeletal: He exhibits tenderness.       Left foot: He exhibits decreased range of motion, tenderness, bony tenderness and swelling. He exhibits normal capillary refill, no crepitus and no deformity.       Feet:  Neurological: He is alert and oriented to person, place, and time.  Skin: Skin is warm and dry.  Psychiatric: He has a normal mood and affect. Judgment normal.    ED Course  Procedures (including critical care time)  Labs Reviewed - No data to display Dg Foot Complete Left  05/03/2011  *RADIOLOGY REPORT*  Clinical Data: Pain.  Blunt injury  LEFT FOOT - COMPLETE 3+ VIEW  Comparison: None.  Findings: There is no evidence of fracture or dislocation.  There is no evidence of arthropathy or other focal bony abnormality. Mild dorsal soft tissue swelling.  IMPRESSION: No visible fracture or dislocation.  Original Report Authenticated By: Elsie Stain, M.D.     No diagnosis found.    MDM          Worthy Rancher, PA 05/03/11 [phone]

## 2011-05-03 NOTE — ED Provider Notes (Signed)
Medical screening examination/treatment/procedure(s) were performed by non-physician practitioner and as supervising physician I was immediately available for consultation/collaboration.   Benny Lennert, MD 05/03/11 2237

## 2011-09-03 ENCOUNTER — Encounter (HOSPITAL_COMMUNITY): Payer: Self-pay

## 2011-09-03 ENCOUNTER — Emergency Department (HOSPITAL_COMMUNITY): Payer: Self-pay

## 2011-09-03 ENCOUNTER — Emergency Department (HOSPITAL_COMMUNITY)
Admission: EM | Admit: 2011-09-03 | Discharge: 2011-09-03 | Disposition: A | Discharge status: Home / Self Care | Payer: Self-pay | Attending: Emergency Medicine | Admitting: Emergency Medicine

## 2011-09-03 DIAGNOSIS — S60229A Contusion of unspecified hand, initial encounter: Secondary | ICD-10-CM

## 2011-09-03 DIAGNOSIS — Y92009 Unspecified place in unspecified non-institutional (private) residence as the place of occurrence of the external cause: Secondary | ICD-10-CM | POA: Insufficient documentation

## 2011-09-03 DIAGNOSIS — S6990XA Unspecified injury of unspecified wrist, hand and finger(s), initial encounter: Secondary | ICD-10-CM | POA: Insufficient documentation

## 2011-09-03 DIAGNOSIS — Y9389 Activity, other specified: Secondary | ICD-10-CM | POA: Insufficient documentation

## 2011-09-03 DIAGNOSIS — Y998 Other external cause status: Secondary | ICD-10-CM | POA: Insufficient documentation

## 2011-09-03 DIAGNOSIS — W2209XA Striking against other stationary object, initial encounter: Secondary | ICD-10-CM | POA: Insufficient documentation

## 2011-09-03 MED ORDER — HYDROCODONE-ACETAMINOPHEN 5-325 MG PO TABS
1.0000 | ORAL_TABLET | Freq: Once | ORAL | Status: AC
  Administered 2011-09-03: 1 via ORAL
  Filled 2011-09-03: qty 1

## 2011-09-03 MED ORDER — OXYCODONE-ACETAMINOPHEN 5-325 MG PO TABS: 1.0000 | ORAL_TABLET | ORAL | Status: AC | PRN

## 2011-09-03 MED ORDER — IBUPROFEN 800 MG PO TABS: 800.0000 mg | ORAL_TABLET | Freq: Three times a day (TID) | ORAL | Status: AC

## 2011-09-03 MED ORDER — DIPHENHYDRAMINE HCL 25 MG PO CAPS
25.0000 mg | ORAL_CAPSULE | Freq: Once | ORAL | Status: AC
  Administered 2011-09-03: 25 mg via ORAL
  Filled 2011-09-03: qty 1

## 2011-09-03 MED ORDER — IBUPROFEN 800 MG PO TABS
800.0000 mg | ORAL_TABLET | Freq: Once | ORAL | Status: AC
  Administered 2011-09-03: 800 mg via ORAL
  Filled 2011-09-03: qty 1

## 2011-09-03 NOTE — ED Provider Notes (Signed)
History     CSN: [id number]  Arrival date & time 09/03/11  1352   First MD Initiated Contact with Patient 09/03/11 1405      Chief Complaint  Patient presents with  . Hand Injury    (Consider location/radiation/quality/duration/timing/severity/associated sxs/prior treatment) HPI Comments: Pain c/o pain to his left hand that began after punching on a punching bag and accidentally struck the metal stand.  Denies numbness or weakness of his hand or fingers.  Also denies wrist pain  Patient is a 26 y.o. male presenting with hand injury. The history is provided by the patient.  Hand Injury  The incident occurred yesterday. The incident occurred at home. The injury mechanism was a direct blow. The pain is present in the left hand. The quality of the pain is described as aching. The pain is moderate. The pain has been constant since the incident. Pertinent negatives include no fever and no malaise/fatigue. He reports no foreign bodies present. The symptoms are aggravated by movement, use and palpation. He has tried NSAIDs for the symptoms. The treatment provided no relief.    Past Medical History  Diagnosis Date  . Asthma     Past Surgical History  Procedure Date  . Hernia repair   . Hernia     Family History  Problem Relation Age of Onset  . Thyroid disease Mother     History  Substance Use Topics  . Smoking status: Never Smoker   . Smokeless tobacco: Not on file  . Alcohol Use: No      Review of Systems  Constitutional: Negative for fever, chills and malaise/fatigue.  Genitourinary: Negative for dysuria and difficulty urinating.  Musculoskeletal: Positive for joint swelling and arthralgias. Negative for back pain.  Skin: Negative for color change and wound.  Neurological: Negative for weakness and numbness.  All other systems reviewed and are negative.    Allergies  Hydrocodone  Home Medications  No current outpatient prescriptions on file.  BP 135/84  Pulse  65  Temp 97.7 F (36.5 C)  Resp 20  Ht 5\' 11"  (1.803 m)  Wt 150 lb (68.04 kg)  BMI 20.92 kg/m2  SpO2 100%  Physical Exam  Nursing note and vitals reviewed. Constitutional: He is oriented to person, place, and time. He appears well-developed and well-nourished. No distress.  HENT:  Head: Normocephalic and atraumatic.  Cardiovascular: Normal rate, regular rhythm and normal heart sounds.   Pulmonary/Chest: Effort normal and breath sounds normal.  Musculoskeletal: He exhibits tenderness. He exhibits no edema.       Left hand: He exhibits tenderness and swelling. He exhibits normal range of motion, no bony tenderness, normal two-point discrimination, normal capillary refill, no deformity and no laceration. normal sensation noted. Normal strength noted.       Hands:      ttp of the dorsal left hand, moderate STS present. No bony deformity.  Radial pulse is brisk, sensation intact.  CR< 2 sec.  No bruising or deformity.  Patient has full ROM.  Neurological: He is alert and oriented to person, place, and time. He exhibits normal muscle tone. Coordination normal.  Skin: Skin is warm and dry.    ED Course  Procedures (including critical care time)  Labs Reviewed - No data to display Dg Hand Complete Left  09/03/2011  *RADIOLOGY REPORT*  Clinical Data: Hand injury, swelling  LEFT HAND - COMPLETE 3+ VIEW  Comparison: 10/03/2010  Findings: Four views of the left hand submitted.  No acute fracture or  subluxation.  There is a soft tissue swelling metacarpal region.  IMPRESSION: No acute fracture or subluxation.  Soft tissue swelling metacarpal region.  Original Report Authenticated By: Natasha Mead, M.D.      Jones dressing applied for comfort.  Remains NV intact.  MDM      Previous medical charts, nursing notes and vitals signs from this visit were reviewed by me   All laboratory results and/or imaging results performed on this visit, if applicable, were reviewed by me and discussed with  the patient and/or parent as well as recommendation for follow-up    MEDICATIONS GIVEN IN ED:  Norco, ibuprofen, benadryl    PRESCRIPTIONS GIVEN AT DISCHARGE:  Percocet # 15, ibuprofen   Pt stable in ED with no significant deterioration in condition. Pt feels improved after observation and/or treatment in ED. Patient / Family / Caregiver understand and agree with initial ED impression and plan with expectations set for ED visit.  Patient agrees to return to ED for any worsening symptoms      Sira Adsit L. [place], [place] 09/07/11 1648

## 2011-09-03 NOTE — ED Notes (Signed)
Pain and swelling in left hand. 

## 2011-09-03 NOTE — Discharge Instructions (Signed)
Contusion (Bruise) of Hand  An injury to the hand may cause bruises (contusions). Contusions are caused by bleeding from small blood vessels (capillaries) that allow blood to leak out into the muscles, tendons, and surrounding soft tissue. This is followed by swelling and pain (inflammation). Contusions of the hand are common because of the use of hands in daily and recreational activities. Signs of a hand injury include pain, swelling, and a color change. Initially the skin may turn blue to purple in color. As the bruise ages, the color turns yellow and orange. Swelling may decrease the movement of the fingers. Contusions are seen more commonly with:   Contact sports (especially in football, wrestling, and basketball).   Use of medications that thin the blood (anticoagulants).   Use of aspirin and nonsteroidal anti-inflammatory agents that decrease the ability of the blood to clot.   Vitamin deficiencies.   Aging.  DIAGNOSIS   Diagnosis of hand injuries can be made by your own observation. If problems continue, a caregiver may be required for further evaluation and treatment. X-rays may be required to make sure there are no broken bones (fractures). Continued problems may require physical therapy for treatment.  RISKS AND COMPLICATIONS   Extensive bleeding and tissue inflammation. This can lead to disability and arthritis-type problems later on if the hand does not heal properly.   Infection of the hand if there are breaks in the skin. This is especially true if the hand injury came from someone's teeth, such as would occur with punching someone in the mouth. This can lead to an infection of the tendons and the membranes surrounding the tendons (sheaths). This infection can have severe complications including a loss of function (a "frozen" hand).   Rupture of the tendons requiring a surgical repair. Failure to repair the tendons can result in loss of function of the hand or fingers.  HOME CARE INSTRUCTIONS     Apply ice to the injury for 15 to 20 minutes, 3 to 4 times per day. Put the ice in a plastic bag and place a towel between the bag of ice and your skin.   An elastic bandage may be used initially for support and to minimize swelling. Do not wrap the hand too tightly. Do not sleep with the elastic bandage on.   Gentle massage from the fingertips towards the elbow will help keep the swelling down. Gently open and close your fist while doing this to maintain range of motion. Do this only after the first few days, when there is no or minimal pain.   Keep your hand above the level of the heart when swelling and pain are present. This will allow the fluid to drain out of the hand, decreasing the amount of swelling. This will improve healing time.   Try to avoid use of the injured hand (except for gentle range of motion) while the hand is hurting. Do not resume use until instructed by your caregiver. Then begin use gradually, do not increase use to the point of pain. If pain does develop, decrease use and continue the above measures, gradually increasing activities that do not cause discomfort until you achieve normal use.   Only take over-the-counter or prescription medicines for pain, discomfort, or fever as directed by your caregiver.   Follow up with your caregiver as directed. Follow-up care may include orthopedic referrals, physical therapy, and rehabilitation. Any delay in obtaining necessary care could result in delayed healing, or temporary or permanent disability.  REHABILITATION     Begin daily rehabilitation exercises when an elastic bandage is no longer needed and you are either pain free or only have minimal pain.   Use ice massage for 10 minutes before and after workouts. Put ice in a plastic bag and place a towel between the bag of ice and your skin. Massage the injured area with the ice pack.  SEEK IMMEDIATE MEDICAL CARE IF:    Your pain and swelling increase, or pain is uncontrolled with  medications.   You have loss of feeling in your hand, or your hand turns cold or blue.   An oral temperature above 102 F (38.9 C) develops, not controlled by medication.   Your hand becomes warm to the touch, or you have increased pain with even slight movement of your fingers.   Your hand does not begin to improve in 1 or 2 days.   The skin is broken and signs of infection occur (fluid draining from the contusion, increasing pain, fever, headache, muscle aches, dizziness, or a general ill feeling).   You develop new, unexplained problems, or an increase of the symptoms that brought you to your caregiver.  MAKE SURE YOU:    Understand these instructions.   Will watch your condition.   Will get help right away if you are not doing well or get worse.  Document Released: 09/03/2001 Document Revised: 03/03/2011 Document Reviewed: 08/21/2009  [employer] Patient Information 2012 [employer].

## 2011-09-07 NOTE — ED Provider Notes (Signed)
Medical screening examination/treatment/procedure(s) were performed by non-physician practitioner and as supervising physician I was immediately available for consultation/collaboration.  Donnetta Hutching, MD 09/07/11 2207

## 2011-10-15 ENCOUNTER — Encounter (HOSPITAL_COMMUNITY): Payer: Self-pay | Admitting: *Deleted

## 2011-10-15 ENCOUNTER — Emergency Department (HOSPITAL_COMMUNITY)
Admission: EM | Admit: 2011-10-15 | Discharge: 2011-10-15 | Disposition: A | Discharge status: Home / Self Care | Payer: Self-pay | Attending: Emergency Medicine | Admitting: Emergency Medicine

## 2011-10-15 DIAGNOSIS — K089 Disorder of teeth and supporting structures, unspecified: Secondary | ICD-10-CM | POA: Insufficient documentation

## 2011-10-15 DIAGNOSIS — K0889 Other specified disorders of teeth and supporting structures: Secondary | ICD-10-CM

## 2011-10-15 MED ORDER — OXYCODONE-ACETAMINOPHEN 5-325 MG PO TABS: 1.0000 | ORAL_TABLET | ORAL | Status: AC | PRN

## 2011-10-15 MED ORDER — OXYCODONE-ACETAMINOPHEN 5-325 MG PO TABS: 1.0000 | ORAL_TABLET | Freq: Once | ORAL | Status: DC

## 2011-10-15 MED ORDER — AMOXICILLIN 500 MG PO CAPS: 500.0000 mg | ORAL_CAPSULE | Freq: Three times a day (TID) | ORAL | Status: AC

## 2011-10-15 MED ORDER — AMOXICILLIN 250 MG PO CAPS: 500.0000 mg | ORAL_CAPSULE | Freq: Once | ORAL | Status: DC

## 2011-10-15 NOTE — ED Notes (Signed)
Pt's ride is in waiting room and has registered to be seen, meds not given due to pt's ride may receive pain meds as well and not able to verify ride

## 2011-10-15 NOTE — ED Notes (Signed)
Pt to get ride to verify that he has someone to drive him home prior to pain med given

## 2011-10-15 NOTE — ED Provider Notes (Signed)
History     CSN: [id number]  Arrival date & time 10/15/11  2211   First MD Initiated Contact with Patient 10/15/11 2233      Chief Complaint  Patient presents with  . Dental Pain    (Consider location/radiation/quality/duration/timing/severity/associated sxs/prior treatment) HPI Comments: Patient c/o pain to his left upper tooth that began 2 days ago when a piece of the tooth "chipped off" while eating hard candy.  C/o sharp pain to this tooth radiating into his face since that time.  No relief with tylenol or ibuprofen. Pain is worse with hot or cold liquids or food.  He denies, fever, bleeding of the gums, facial swelling or ear pain.   Patient is a 26 y.o. male presenting with tooth pain. The history is provided by the patient.  Dental PainThe primary symptoms include mouth pain. Primary symptoms do not include oral bleeding, headaches, fever, shortness of breath, sore throat or angioedema. The symptoms began 2 days ago. The symptoms are worsening. The symptoms are new. The symptoms occur constantly.  Mouth pain began 24 -48 hours ago. Mouth pain occurs constantly. Mouth pain is unchanged. Affected locations include: teeth.  Additional symptoms include: dental sensitivity to temperature. Additional symptoms do not include: gum swelling, gum tenderness, trismus, jaw pain, facial swelling, trouble swallowing, pain with swallowing and fatigue. Medical issues do not include: smoking and periodontal disease.    Past Medical History  Diagnosis Date  . Asthma     Past Surgical History  Procedure Date  . Hernia repair   . Hernia     Family History  Problem Relation Age of Onset  . Thyroid disease Mother     History  Substance Use Topics  . Smoking status: Never Smoker   . Smokeless tobacco: Not on file  . Alcohol Use: No      Review of Systems  Constitutional: Negative for fever, chills and fatigue.  HENT: Positive for dental problem. Negative for sore throat, facial  swelling, trouble swallowing, neck pain and neck stiffness.   Respiratory: Negative for shortness of breath.   Gastrointestinal: Negative for nausea and vomiting.  Skin: Negative for rash.  Neurological: Negative for dizziness, numbness and headaches.  Hematological: Does not bruise/bleed easily.  All other systems reviewed and are negative.    Allergies  Hydrocodone  Home Medications  No current outpatient prescriptions on file.  BP 144/79  Pulse 70  Temp 98.5 F (36.9 C) (Oral)  Resp 16  Ht 5\' 11"  (1.803 m)  Wt 150 lb (68.04 kg)  BMI 20.92 kg/m2  SpO2 100%  Physical Exam  Nursing note and vitals reviewed. Constitutional: He is oriented to person, place, and time. He appears well-developed and well-nourished. No distress.  HENT:  Head: Normocephalic and atraumatic. No trismus in the jaw.  Mouth/Throat: Uvula is midline, oropharynx is clear and moist and mucous membranes are normal. Dental caries present. No dental abscesses or uvula swelling.         parietal avulsion of the left upper second premolar.  No abscess  Neck: Normal range of motion. Neck supple.  Cardiovascular: Normal rate, regular rhythm and normal heart sounds.   No murmur heard. Pulmonary/Chest: Effort normal and breath sounds normal.  Musculoskeletal: Normal range of motion.  Lymphadenopathy:    He has no cervical adenopathy.  Neurological: He is alert and oriented to person, place, and time. He exhibits normal muscle tone. Coordination normal.  Skin: Skin is warm and dry.    ED Course  Procedures (including critical care time)  Labs Reviewed - No data to display      MDM   ttp of the left upper premolar.  No dental abscess, facial edema or trismus.  Non-toxic appearing.    The patient appears reasonably screened and/or stabilized for discharge and I doubt any other medical condition or other [facility] requiring further screening, evaluation, or treatment in the ED at this time prior to  discharge.    Prescribed: Amoxil Percocet #15   Maybelline Kolarik L. [place], [place] 10/15/11 2307

## 2011-10-15 NOTE — ED Notes (Signed)
Pt with broken tooth to left upper, denies having a dentist, dentist list provided for pt

## 2011-10-15 NOTE — ED Notes (Signed)
Pt reporting toothache that started a couple days ago.  States was chewing on candy, and believes he broke a tooth.

## 2011-10-16 NOTE — ED Provider Notes (Signed)
Medical screening examination/treatment/procedure(s) were performed by non-physician practitioner and as supervising physician I was immediately available for consultation/collaboration.  Flint Melter, MD 10/16/11 0110

## 2011-10-31 ENCOUNTER — Encounter (HOSPITAL_COMMUNITY): Payer: Self-pay

## 2011-10-31 ENCOUNTER — Emergency Department (HOSPITAL_COMMUNITY)
Admission: EM | Admit: 2011-10-31 | Discharge: 2011-10-31 | Disposition: A | Discharge status: Home / Self Care | Payer: Self-pay | Attending: Emergency Medicine | Admitting: Emergency Medicine

## 2011-10-31 DIAGNOSIS — J45909 Unspecified asthma, uncomplicated: Secondary | ICD-10-CM | POA: Insufficient documentation

## 2011-10-31 DIAGNOSIS — R1032 Left lower quadrant pain: Secondary | ICD-10-CM | POA: Insufficient documentation

## 2011-10-31 MED ORDER — OXYCODONE-ACETAMINOPHEN 5-325 MG PO TABS: 1.0000 | ORAL_TABLET | ORAL | Status: AC | PRN

## 2011-10-31 NOTE — ED Notes (Signed)
Pt reports severe left side ab pain x5 days, was doing some heavy lifting and thinks he may have torn the "mesh from his hernia repair", describes as insides coming apart

## 2011-10-31 NOTE — ED Notes (Signed)
Patient would like to know how much longer before he is seen. Patient was told that it would not be much longer. RN made aware.

## 2011-10-31 NOTE — ED Notes (Signed)
RN at bedside

## 2011-11-01 NOTE — ED Provider Notes (Signed)
Medical screening examination/treatment/procedure(s) were performed by non-physician practitioner and as supervising physician I was immediately available for consultation/collaboration.   Carleene Cooper III, MD 11/01/11 [phone]

## 2011-11-01 NOTE — ED Provider Notes (Signed)
History     CSN: [id number]  Arrival date & time 10/31/11  1046   First MD Initiated Contact with Patient 10/31/11 1221      Chief Complaint  Patient presents with  . Abdominal Pain    (Consider location/radiation/quality/duration/timing/severity/associated sxs/prior treatment) HPI Comments: Marcus Mora with complaint of intermittent pain in his left lower quadrant since helping his father move a sofa 5 days ago.  He reports chronic "soreness" at this site since he had a mesh repair of a left inguinal hernia last year.  He felt a sharp tearing type pain when lifting last week and since has felt a knot near his surgery scar that comes and goes,  But is painful when present.  It is worse when coughing,  Sneezing or bearing down.  It resolves when his is lying down or resting.  He denies fevers,  Chills, nausea and diarrhea or constipation.   He has not taken any medicines nor has he contacted his surgeon about this problem.   The history is provided by the patient.    Past Medical History  Diagnosis Date  . Asthma     Past Surgical History  Procedure Date  . Hernia repair   . Hernia     Family History  Problem Relation Age of Onset  . Thyroid disease Mother     History  Substance Use Topics  . Smoking status: Never Smoker   . Smokeless tobacco: Not on file  . Alcohol Use: No      Review of Systems  Constitutional: Negative for fever.  HENT: Negative for congestion, sore throat and neck pain.   Eyes: Negative.   Respiratory: Negative for chest tightness and shortness of breath.   Cardiovascular: Negative for chest pain.  Gastrointestinal: Positive for abdominal pain. Negative for nausea, vomiting, diarrhea, constipation and abdominal distention.  Genitourinary: Negative.   Musculoskeletal: Negative for joint swelling and arthralgias.  Skin: Negative.  Negative for rash and wound.  Neurological: Negative for dizziness, weakness, light-headedness, numbness and  headaches.  Hematological: Negative.   Psychiatric/Behavioral: Negative.     Allergies  Hydrocodone  Home Medications   Current Outpatient Rx  Name Route Sig Dispense Refill  . OXYCODONE-ACETAMINOPHEN 5-325 MG PO TABS Oral Take 1 tablet by mouth every 4 (four) hours as needed for pain. 20 tablet 0    BP 141/92  Pulse 64  Temp 97.8 F (36.6 C) (Oral)  Resp 18  Ht 5\' 11"  (1.803 m)  Wt 155 lb (70.308 kg)  BMI 21.62 kg/m2  SpO2 100%  Physical Exam  Nursing note and vitals reviewed. Constitutional: He appears well-developed and well-nourished.  HENT:  Head: Normocephalic and atraumatic.  Eyes: Conjunctivae are normal.  Neck: Normal range of motion.  Cardiovascular: Normal rate, regular rhythm, normal heart sounds and intact distal pulses.   Pulmonary/Chest: Effort normal and breath sounds normal. He has no wheezes.  Abdominal: Soft. Bowel sounds are normal. He exhibits no distension. There is tenderness in the left lower quadrant. There is no rebound and no guarding.       Thin individual with no excess abdominal adiposity.  TTP llq without guarding or rebound.  Small mass noted just medial to the medial end of his inguinal hernial incision line which is well healed.  This resolves spontaneously when supine without need for manual reduction.    Musculoskeletal: Normal range of motion.  Neurological: He is alert.  Skin: Skin is warm and dry.  Psychiatric: He has a  normal mood and affect.    ED Course  Procedures (including critical care time)  Labs Reviewed - No data to display No results found.   1. Inguinal pain, lower left quadrant       MDM  Suspect patient does have return of his inguinal hernia, which reduces spontaneously.  He was prescribed oxycodone for prn use, sedation caution given.  He was advised that he needs to f/u with Dr Malvin Johns for re-evaluation by him.  Pt understands.  Also discussed reasons for immediate return,  Including increased pain,  Fever  and hernia that will not reduce with gentle pressure.        Burgess Amor, [place] 11/01/11 [phone]

## 2011-11-08 ENCOUNTER — Encounter (HOSPITAL_COMMUNITY): Payer: Self-pay | Admitting: Emergency Medicine

## 2011-11-08 DIAGNOSIS — J45909 Unspecified asthma, uncomplicated: Secondary | ICD-10-CM | POA: Insufficient documentation

## 2011-11-08 DIAGNOSIS — R109 Unspecified abdominal pain: Secondary | ICD-10-CM | POA: Insufficient documentation

## 2011-11-08 NOTE — ED Notes (Signed)
Patient states he had hernia surgery 5 years ago and was heavy lifting last night and felt something pull in his groin area. Complaining of groin pain.

## 2011-11-09 ENCOUNTER — Emergency Department (HOSPITAL_COMMUNITY)
Admission: EM | Admit: 2011-11-09 | Discharge: 2011-11-09 | Disposition: A | Discharge status: Home / Self Care | Payer: Self-pay | Attending: Emergency Medicine | Admitting: Emergency Medicine

## 2011-11-09 DIAGNOSIS — R1030 Lower abdominal pain, unspecified: Secondary | ICD-10-CM

## 2011-11-09 MED ORDER — IBUPROFEN 800 MG PO TABS: 800.0000 mg | ORAL_TABLET | Freq: Three times a day (TID) | ORAL | Status: AC

## 2011-11-09 MED ORDER — OXYCODONE-ACETAMINOPHEN 5-325 MG PO TABS
1.0000 | ORAL_TABLET | Freq: Once | ORAL | Status: AC
  Administered 2011-11-09: 1 via ORAL
  Filled 2011-11-09: qty 1

## 2011-11-09 MED ORDER — IBUPROFEN 800 MG PO TABS
800.0000 mg | ORAL_TABLET | Freq: Once | ORAL | Status: AC
  Administered 2011-11-09: 800 mg via ORAL
  Filled 2011-11-09: qty 1

## 2011-11-09 NOTE — ED Provider Notes (Signed)
History     CSN: [id number]  Arrival date & time 11/08/11  2218   First MD Initiated Contact with Patient 11/09/11 0010      Chief Complaint  Patient presents with  . Groin Pain    (Consider location/radiation/quality/duration/timing/severity/associated sxs/prior treatment) HPI Marcus Mora is a 26 y.o. male who presents to the Emergency Department complaining of left groin pain after moving furniture. He had a hernia repair 5 years ago and while moving furniture felt something rip. Has had some swelling at the hernia site. Seen for a similar complaint with moving of furniture 10/31/2011. Has not followed up with his surgeon.  Past Medical History  Diagnosis Date  . Asthma     Past Surgical History  Procedure Date  . Hernia repair   . Hernia     Family History  Problem Relation Age of Onset  . Thyroid disease Mother     History  Substance Use Topics  . Smoking status: Never Smoker   . Smokeless tobacco: Not on file  . Alcohol Use: No      Review of Systems  Constitutional: Negative for fever.       10 Systems reviewed and are negative for acute change except as noted in the HPI.  HENT: Negative for congestion.   Eyes: Negative for discharge and redness.  Respiratory: Negative for cough and shortness of breath.   Cardiovascular: Negative for chest pain.  Gastrointestinal: Negative for vomiting and abdominal pain.  Genitourinary:       Left groin pain  Musculoskeletal: Negative for back pain.  Skin: Negative for rash.  Neurological: Negative for syncope, numbness and headaches.  Psychiatric/Behavioral:       No behavior change.    Allergies  Hydrocodone  Home Medications   Current Outpatient Rx  Name Route Sig Dispense Refill  . IBUPROFEN 800 MG PO TABS Oral Take 1 tablet (800 mg total) by mouth 3 (three) times daily. 21 tablet 0  . OXYCODONE-ACETAMINOPHEN 5-325 MG PO TABS Oral Take 1 tablet by mouth every 4 (four) hours as needed for pain. 20  tablet 0    BP 167/87  Pulse 110  Temp 97.5 F (36.4 C) (Oral)  Resp 24  Ht 5\' 10"  (1.778 m)  Wt 155 lb (70.308 kg)  BMI 22.24 kg/m2  SpO2 100%  Physical Exam  Nursing note and vitals reviewed. Constitutional:       Awake, alert, nontoxic appearance.  HENT:  Head: Atraumatic.  Eyes: Right eye exhibits no discharge. Left eye exhibits no discharge.  Neck: Neck supple.  Cardiovascular: Normal heart sounds.   Pulmonary/Chest: Effort normal and breath sounds normal. He exhibits no tenderness.  Abdominal: Soft. There is no tenderness. There is no rebound.  Genitourinary:       Tenderness with palpation of left groin, no recurrent hernia noted.  Musculoskeletal: He exhibits no tenderness.       Baseline ROM, no obvious new focal weakness.  Neurological:       Mental status and motor strength appears baseline for patient and situation.  Skin: No rash noted.  Psychiatric: He has a normal mood and affect.    ED Course  Procedures (including critical care time)    1. Groin pain       MDM  Patient with recurrent groin pain at hernia site with moving furniture. Recent visit to the ER for the same. Given antiinflammatory and analgesic. Referral to surgeon. Pt stable in ED with no significant deterioration in  condition.The patient appears reasonably screened and/or stabilized for discharge and I doubt any other medical condition or other [facility] requiring further screening, evaluation, or treatment in the ED at this time prior to discharge.  MDM Reviewed: nursing note, vitals and previous chart          Nicoletta Dress. Colon Branch, MD 11/09/11 0110

## 2011-11-09 NOTE — ED Notes (Signed)
States that he was moving heavy furniture on Monday night, states that he feels like he "ripped" his hernia, states that he noticed bulging and came to the ER to be checked out.

## 2011-11-17 ENCOUNTER — Emergency Department (HOSPITAL_COMMUNITY)
Admission: EM | Admit: 2011-11-17 | Discharge: 2011-11-17 | Disposition: A | Discharge status: Home / Self Care | Payer: Self-pay | Attending: Emergency Medicine | Admitting: Emergency Medicine

## 2011-11-17 ENCOUNTER — Encounter (HOSPITAL_COMMUNITY): Payer: Self-pay | Admitting: *Deleted

## 2011-11-17 DIAGNOSIS — K4091 Unilateral inguinal hernia, without obstruction or gangrene, recurrent: Secondary | ICD-10-CM | POA: Insufficient documentation

## 2011-11-17 DIAGNOSIS — K4090 Unilateral inguinal hernia, without obstruction or gangrene, not specified as recurrent: Secondary | ICD-10-CM

## 2011-11-17 DIAGNOSIS — J45909 Unspecified asthma, uncomplicated: Secondary | ICD-10-CM | POA: Insufficient documentation

## 2011-11-17 MED ORDER — TRAMADOL HCL 50 MG PO TABS: 50.0000 mg | ORAL_TABLET | Freq: Four times a day (QID) | ORAL | Status: AC | PRN

## 2011-11-17 NOTE — ED Notes (Signed)
Pt stable at discharge Pt verbalizes understanding to call surgeon in am to confirm appt for 1030. Pt instructed not to drive while on pain medication. Pt also strongly encouraged to rest and use ice. Pt verbalizes understanding

## 2011-11-17 NOTE — ED Notes (Signed)
Pt c/o pain in his groin for 3 weeks. Pt was diagnosed with a hernia and told to follow up with surgeon. Pt has an appointment on November 29, 2011. Pt states that he needs pain medication and was told to come to ED.

## 2011-11-17 NOTE — ED Notes (Signed)
Pt reports has left inguinal hernia and is supposed to see a Careers adviser.  Says was told to come here to get prescription for pain medication if he couldn't wait until his appt in sept.  Area pink and swollen.

## 2011-11-17 NOTE — ED Provider Notes (Signed)
History   This chart was scribed for Donnetta Hutching, MD by Sofie Rower. The patient was seen in room APA18/APA18 and the patient's care was started at 6:28 PM     CSN: [id number]  Arrival date & time 11/17/11  1736   First MD Initiated Contact with Patient 11/17/11 1753      Chief Complaint  Patient presents with  . Groin Pain    (Consider location/radiation/quality/duration/timing/severity/associated sxs/prior treatment) The history is provided by the patient. No language interpreter was used.    Marcus Mora is a 26 y.o. male , with a hx of hernia repair, who presents to the Emergency Department complaining of gradual, progressively worsening, groin pain, onset three weeks ago. The pt reports he has been experiencing pain in his groin described as if someone is ripping his skin. The pt is scheduled to follow up with Dr. Malvin Johns, concerning surgery on November 29, 2011. The pt has a hx of hernia and left inguinal hernia repair (Performed by Dr. Malvin Johns in 2008).  The pt does not smoke or drink alcohol.        Past Medical History  Diagnosis Date  . Asthma     Past Surgical History  Procedure Date  . Hernia repair   . Hernia     Family History  Problem Relation Age of Onset  . Thyroid disease Mother     History  Substance Use Topics  . Smoking status: Never Smoker   . Smokeless tobacco: Not on file  . Alcohol Use: No      Review of Systems  All other systems reviewed and are negative.    Allergies  Hydrocodone  Home Medications   Current Outpatient Rx  Name Route Sig Dispense Refill  . IBUPROFEN 800 MG PO TABS Oral Take 1 tablet (800 mg total) by mouth 3 (three) times daily. 21 tablet 0    BP 146/90  Pulse 94  Temp 98 F (36.7 C) (Oral)  Resp 16  Ht 5\' 11"  (1.803 m)  Wt 155 lb (70.308 kg)  BMI 21.62 kg/m2  SpO2 100%  Physical Exam  Nursing note and vitals reviewed. Constitutional: He is oriented to person, place, and time. He appears  well-developed and well-nourished.  HENT:  Head: Atraumatic.  Nose: Nose normal.  Eyes: Conjunctivae are normal. Pupils are equal, round, and reactive to light.  Neck: Normal range of motion.  Cardiovascular: Normal rate.   Pulmonary/Chest: Effort normal.  Genitourinary: Penis normal.       Left suprapubic 2.2 cm hernia.   Musculoskeletal: Normal range of motion.  Neurological: He is alert and oriented to person, place, and time.  Skin: Skin is warm and dry.  Psychiatric: He has a normal mood and affect. His behavior is normal.    ED Course  Procedures (including critical care time)  DIAGNOSTIC STUDIES: Oxygen Saturation is 100% on room air, normal by my interpretation.    COORDINATION OF CARE:    6:32PM- Follow up with Dr. Malvin Johns discussed.   7:23PM- Phone Consultation with Dr. Malvin Johns. Dr. Malvin Johns will see pt tomorrow at 10:30AM.   Labs Reviewed - No data to display No results found.   No diagnosis found.    MDM  Status post left inguinal hernia repair approximately 3 years ago with mesh by Dr. Malvin Johns.  Patient has recurrent symptoms. No acute abdomen. No obvious incarceration. Discussed with Dr. Malvin Johns. He will see patient tomorrow morning at 10:30     I personally performed the  services described in this documentation, which was scribed in my presence. The recorded information has been reviewed and considered.   Donnetta Hutching, MD 11/17/11 [phone]

## 2011-12-09 ENCOUNTER — Encounter (HOSPITAL_COMMUNITY): Payer: Self-pay | Admitting: *Deleted

## 2011-12-09 ENCOUNTER — Emergency Department (HOSPITAL_COMMUNITY)
Admission: EM | Admit: 2011-12-09 | Discharge: 2011-12-09 | Disposition: A | Discharge status: Home / Self Care | Payer: Self-pay | Attending: Emergency Medicine | Admitting: Emergency Medicine

## 2011-12-09 DIAGNOSIS — Z885 Allergy status to narcotic agent status: Secondary | ICD-10-CM | POA: Insufficient documentation

## 2011-12-09 DIAGNOSIS — K0889 Other specified disorders of teeth and supporting structures: Secondary | ICD-10-CM

## 2011-12-09 DIAGNOSIS — J45909 Unspecified asthma, uncomplicated: Secondary | ICD-10-CM | POA: Insufficient documentation

## 2011-12-09 DIAGNOSIS — K089 Disorder of teeth and supporting structures, unspecified: Secondary | ICD-10-CM | POA: Insufficient documentation

## 2011-12-09 MED ORDER — PENICILLIN V POTASSIUM 500 MG PO TABS: 500.0000 mg | ORAL_TABLET | Freq: Three times a day (TID) | ORAL | Status: AC

## 2011-12-09 MED ORDER — OXYCODONE-ACETAMINOPHEN 5-325 MG PO TABS: 2.0000 | ORAL_TABLET | ORAL | Status: AC | PRN

## 2011-12-09 NOTE — ED Provider Notes (Signed)
History     CSN: [id number]  Arrival date & time 12/09/11  1234   First MD Initiated Contact with Patient 12/09/11 1253      Chief Complaint  Patient presents with  . Dental Pain    HPI Marcus Mora is a 26 y.o. male who presents to the ED with dental pain. He states he was eating hard candy and broke a tooth. He brought the broken tooth with him. The pain is located in the upper right. He denies any other problems.  Past Medical History  Diagnosis Date  . Asthma     Past Surgical History  Procedure Date  . Hernia repair   . Hernia     Family History  Problem Relation Age of Onset  . Thyroid disease Mother     History  Substance Use Topics  . Smoking status: Never Smoker   . Smokeless tobacco: Not on file  . Alcohol Use: No      Review of Systems: As stated in HPI  Allergies  Hydrocodone  Home Medications  No current outpatient prescriptions on file.  BP 136/80  Pulse 84  Temp 97.8 F (36.6 C) (Oral)  Resp 18  Ht 5\' 11"  (1.803 m)  Wt 145 lb (65.772 kg)  BMI 20.22 kg/m2  SpO2 100%  Physical Exam  Nursing note and vitals reviewed. Constitutional: He is oriented to person, place, and time. He appears well-developed and well-nourished. No distress.  HENT:  Head: Normocephalic.  Mouth/Throat: Uvula is midline, oropharynx is clear and moist and mucous membranes are normal.    Neck: Neck supple.  Cardiovascular: Normal rate.   Pulmonary/Chest: Effort normal.  Musculoskeletal: Normal range of motion.  Neurological: He is alert and oriented to person, place, and time.  Skin: Skin is warm and dry.  Psychiatric: He has a normal mood and affect. His behavior is normal. Judgment and thought content normal.   Assessment: Dental pain  Plan:  Pain management   Antibiotics   Follow up with dentist ASAP, return as needed   Information given for free clinic 9/27 and 9/28. Discussed with the patient and all questioned fully answered. He will follow up  with the free dental clinic or return if any problems arise.   Medication List     As of 12/10/2011  8:02 AM    START taking these medications         oxyCODONE-acetaminophen 5-325 MG per tablet   Commonly known as: PERCOCET/ROXICET   Take 2 tablets by mouth every 4 (four) hours as needed for pain.      penicillin v potassium 500 MG tablet   Commonly known as: VEETID   Take 1 tablet (500 mg total) by mouth 3 (three) times daily.          Where to get your medications    These are the prescriptions that you need to pick up.   You may get these medications from any pharmacy.         oxyCODONE-acetaminophen 5-325 MG per tablet   penicillin v potassium 500 MG tablet           Procedures         Janne Napoleon, NP 12/10/11 [phone]

## 2011-12-09 NOTE — ED Notes (Signed)
Pt says he was eating candy and part of rt molar broke on Tuesday, Pain since then

## 2011-12-09 NOTE — ED Notes (Signed)
Pt here for toothache that started on right side of mouth Tuesday after part of tooth broke off. Has not been to a dentist.

## 2011-12-10 NOTE — ED Provider Notes (Signed)
Medical screening examination/treatment/procedure(s) were performed by non-physician practitioner and as supervising physician I was immediately available for consultation/collaboration.  Claire Dolores, MD 12/10/11 0823 

## 2011-12-29 ENCOUNTER — Other Ambulatory Visit: Payer: Self-pay | Admitting: Oncology

## 2012-01-21 ENCOUNTER — Emergency Department (HOSPITAL_COMMUNITY): Payer: Self-pay

## 2012-01-21 ENCOUNTER — Encounter (HOSPITAL_COMMUNITY): Payer: Self-pay | Admitting: *Deleted

## 2012-01-21 ENCOUNTER — Emergency Department (HOSPITAL_COMMUNITY)
Admission: EM | Admit: 2012-01-21 | Discharge: 2012-01-21 | Disposition: A | Discharge status: Home / Self Care | Payer: Self-pay | Attending: Emergency Medicine | Admitting: Emergency Medicine

## 2012-01-21 DIAGNOSIS — S0083XA Contusion of other part of head, initial encounter: Secondary | ICD-10-CM

## 2012-01-21 DIAGNOSIS — J45909 Unspecified asthma, uncomplicated: Secondary | ICD-10-CM | POA: Insufficient documentation

## 2012-01-21 DIAGNOSIS — S1093XA Contusion of unspecified part of neck, initial encounter: Secondary | ICD-10-CM | POA: Insufficient documentation

## 2012-01-21 DIAGNOSIS — S0003XA Contusion of scalp, initial encounter: Secondary | ICD-10-CM | POA: Insufficient documentation

## 2012-01-21 MED ORDER — OXYCODONE-ACETAMINOPHEN 5-325 MG PO TABS: 1.0000 | ORAL_TABLET | ORAL | Status: DC | PRN

## 2012-01-21 MED ORDER — HYDROCODONE-ACETAMINOPHEN 5-325 MG PO TABS: 1.0000 | ORAL_TABLET | ORAL | Status: DC | PRN
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
## 2012-03-12 ENCOUNTER — Emergency Department (HOSPITAL_COMMUNITY)
Admission: EM | Admit: 2012-03-12 | Discharge: 2012-03-13 | Disposition: A | Discharge status: Home / Self Care | Payer: Self-pay | Attending: Emergency Medicine | Admitting: Emergency Medicine

## 2012-03-12 ENCOUNTER — Encounter (HOSPITAL_COMMUNITY): Payer: Self-pay | Admitting: *Deleted

## 2012-03-12 ENCOUNTER — Emergency Department (HOSPITAL_COMMUNITY): Payer: Self-pay

## 2012-03-12 DIAGNOSIS — S335XXA Sprain of ligaments of lumbar spine, initial encounter: Secondary | ICD-10-CM | POA: Insufficient documentation

## 2012-03-12 DIAGNOSIS — J45901 Unspecified asthma with (acute) exacerbation: Secondary | ICD-10-CM | POA: Insufficient documentation

## 2012-03-12 DIAGNOSIS — Y9389 Activity, other specified: Secondary | ICD-10-CM | POA: Insufficient documentation

## 2012-03-12 DIAGNOSIS — Y929 Unspecified place or not applicable: Secondary | ICD-10-CM | POA: Insufficient documentation

## 2012-03-12 DIAGNOSIS — S39012A Strain of muscle, fascia and tendon of lower back, initial encounter: Secondary | ICD-10-CM

## 2012-03-12 DIAGNOSIS — S60229A Contusion of unspecified hand, initial encounter: Secondary | ICD-10-CM

## 2012-03-12 DIAGNOSIS — W230XXA Caught, crushed, jammed, or pinched between moving objects, initial encounter: Secondary | ICD-10-CM | POA: Insufficient documentation

## 2012-03-12 DIAGNOSIS — X500XXA Overexertion from strenuous movement or load, initial encounter: Secondary | ICD-10-CM | POA: Insufficient documentation

## 2012-03-12 NOTE — ED Notes (Signed)
Swelling noted to dorsum of right hand.

## 2012-03-12 NOTE — ED Notes (Signed)
Pt states that he was moving a motor on a cherry picker today, slipped with pt holding on to it, states it mashed his right hand, right hand swelling noted, also c/o lower back pain and right knee pain

## 2012-03-12 NOTE — ED Provider Notes (Signed)
History     CSN: [id number]  Arrival date & time 03/12/12  2245   First MD Initiated Contact with Patient 03/12/12 2310      Chief Complaint  Patient presents with  . Back Pain  . Hand Pain  . Knee Pain    (Consider location/radiation/quality/duration/timing/severity/associated sxs/prior treatment) HPI Comments: Patient presents to the emergency department with complaint of injury to the right hand to the knee and to the lower back. The patient states that he was moving a motor to a work table. A cherry picker that was being used to move the motor placed in a motor to close to a petition and mashed his right hand. The patient then had a near fall injured his back and his right knee. This occurred approximately 1 PM. The pain got progressively worse and the patient presented to the emergency department for additional evaluation. The patient has not taken any medication up to this point for this injury.  Patient is a 26 y.o. male presenting with back pain, hand pain, and knee pain. The history is provided by the patient.  Back Pain  Pertinent negatives include no chest pain, no abdominal pain and no dysuria.  Hand Pain Pertinent negatives include no abdominal pain, arthralgias, chest pain, coughing or neck pain.  Knee Pain Pertinent negatives include no abdominal pain, arthralgias, chest pain, coughing or neck pain.    Past Medical History  Diagnosis Date  . Asthma     Past Surgical History  Procedure Date  . Hernia repair   . Hernia     Family History  Problem Relation Age of Onset  . Thyroid disease Mother     History  Substance Use Topics  . Smoking status: Never Smoker   . Smokeless tobacco: Not on file  . Alcohol Use: No     Comment: denies use 03/12/12      Review of Systems  Constitutional: Negative for activity change.       All ROS Neg except as noted in HPI  HENT: Negative for nosebleeds and neck pain.   Eyes: Negative for photophobia and discharge.   Respiratory: Positive for wheezing. Negative for cough and shortness of breath.   Cardiovascular: Negative for chest pain and palpitations.  Gastrointestinal: Negative for abdominal pain and blood in stool.  Genitourinary: Negative for dysuria, frequency and hematuria.  Musculoskeletal: Positive for back pain. Negative for arthralgias.       Hand pain, knee pain  Skin: Negative.   Neurological: Negative for dizziness, seizures and speech difficulty.  Psychiatric/Behavioral: Negative for hallucinations and confusion.    Allergies  Hydrocodone  Home Medications   Current Outpatient Rx  Name  Route  Sig  Dispense  Refill  . OXYCODONE-ACETAMINOPHEN 5-325 MG PO TABS   Oral   Take 1 tablet by mouth every 4 (four) hours as needed for pain.   15 tablet   0     BP 135/77  Pulse 110  Temp 97.4 F (36.3 C) (Oral)  Resp 20  Ht 5\' 11"  (1.803 m)  Wt 145 lb (65.772 kg)  BMI 20.22 kg/m2  SpO2 100%  Physical Exam  Nursing note and vitals reviewed. Constitutional: He is oriented to person, place, and time. He appears well-developed and well-nourished.  Non-toxic appearance.  HENT:  Head: Normocephalic.  Right Ear: Tympanic membrane and external ear normal.  Left Ear: Tympanic membrane and external ear normal.  Eyes: EOM and lids are normal. Pupils are equal, round, and reactive to  light.  Neck: Normal range of motion. Neck supple. Carotid bruit is not present.  Cardiovascular: Normal rate, regular rhythm, normal heart sounds, intact distal pulses and normal pulses.   Pulmonary/Chest: Breath sounds normal. No respiratory distress.  Abdominal: Soft. Bowel sounds are normal. There is no tenderness. There is no guarding.  Musculoskeletal: Normal range of motion.       There is mild to mod bruising of the dorsum of the right hand. Mod swelling present. FROM of all fingers. Cap refill less than 3 sec. Radial and brachial pulse 2+. FROM of the right shoulder. There is good range of motion  of the right knee. No palpable deformity appreciated. No effusion.  there is no palpable step off on the lumbar examination. There is some mild paraspinal tenderness and questionable spasm. No hot areas appreciated.  Lymphadenopathy:       Head (right side): No submandibular adenopathy present.       Head (left side): No submandibular adenopathy present.    He has no cervical adenopathy.  Neurological: He is alert and oriented to person, place, and time. He has normal strength. No cranial nerve deficit or sensory deficit.  Skin: Skin is warm and dry.  Psychiatric: He has a normal mood and affect. His speech is normal.    ED Course  Procedures (including critical care time)  Labs Reviewed - No data to display No results found.   No diagnosis found.    MDM  I have reviewed nursing notes, vital signs, and all appropriate lab and imaging results for this patient. X-ray of the right hand reveals mild soft tissue swelling but no fracture or dislocation. The patient had a Lenora Boys splint applied to the right hand. Ice pack was applied. The plan at this time is for the patient to receive Tylenol with codeine #3 #20 tablets and baclofen #21 tablets for the soreness of the hand and the spasm in the lower back. Patient is to see his primary physician for additional evaluation and management.       Kathie Dike, [place] 03/13/12 [phone]

## 2012-03-13 MED ORDER — ACETAMINOPHEN-CODEINE #3 300-30 MG PO TABS: 1.0000 | ORAL_TABLET | ORAL | Status: DC | PRN

## 2012-03-13 MED ORDER — BACLOFEN 10 MG PO TABS: 10.0000 mg | ORAL_TABLET | Freq: Three times a day (TID) | ORAL | Status: DC

## 2012-03-13 NOTE — ED Provider Notes (Signed)
Medical screening examination/treatment/procedure(s) were performed by non-physician practitioner and as supervising physician I was immediately available for consultation/collaboration.   Dione Booze, MD 03/13/12 [phone]

## 2012-03-26 ENCOUNTER — Emergency Department (HOSPITAL_COMMUNITY)
Admission: EM | Admit: 2012-03-26 | Discharge: 2012-03-26 | Disposition: A | Discharge status: Home / Self Care | Payer: Self-pay | Attending: Emergency Medicine | Admitting: Emergency Medicine

## 2012-03-26 ENCOUNTER — Encounter (HOSPITAL_COMMUNITY): Payer: Self-pay | Admitting: *Deleted

## 2012-03-26 DIAGNOSIS — K089 Disorder of teeth and supporting structures, unspecified: Secondary | ICD-10-CM | POA: Insufficient documentation

## 2012-03-26 DIAGNOSIS — J45909 Unspecified asthma, uncomplicated: Secondary | ICD-10-CM | POA: Insufficient documentation

## 2012-03-26 DIAGNOSIS — K0889 Other specified disorders of teeth and supporting structures: Secondary | ICD-10-CM

## 2012-03-26 MED ORDER — HYDROCODONE-ACETAMINOPHEN 5-325 MG PO TABS: 1.0000 | ORAL_TABLET | Freq: Once | ORAL | Status: DC

## 2012-03-26 MED ORDER — OXYCODONE-ACETAMINOPHEN 5-325 MG PO TABS
1.0000 | ORAL_TABLET | Freq: Once | ORAL | Status: AC
  Administered 2012-03-26: 1 via ORAL
  Filled 2012-03-26: qty 1

## 2012-03-26 MED ORDER — IBUPROFEN 800 MG PO TABS
800.0000 mg | ORAL_TABLET | Freq: Once | ORAL | Status: AC
  Administered 2012-03-26: 800 mg via ORAL
  Filled 2012-03-26: qty 1

## 2012-03-26 MED ORDER — IBUPROFEN 800 MG PO TABS: 800.0000 mg | ORAL_TABLET | Freq: Three times a day (TID) | ORAL | Status: DC

## 2012-03-26 NOTE — ED Notes (Signed)
Pt discharged. Pt stable at time of discharge. Medications reviewed pt has no questions regarding discharge at this time. Pt voiced understanding of discharge instructions.  

## 2012-03-26 NOTE — ED Provider Notes (Signed)
History     CSN: [id number]  Arrival date & time 03/26/12  0201   First MD Initiated Contact with Patient 03/26/12 [phone]      Chief Complaint  Patient presents with  . Dental Pain    (Consider location/radiation/quality/duration/timing/severity/associated sxs/prior treatment) HPI Marcus Mora is a 26 y.o. male who presents to the Emergency Department complaining of dental pain after eating a candy cane on Friday. It broke two teeth and he lost the filling and part of one tooth. He now has pain to both teeth. He has taken tylenol with no relief. Brething in cold air make the teeth hurt worse.  Past Medical History  Diagnosis Date  . Asthma     Past Surgical History  Procedure Date  . Hernia repair   . Hernia     Family History  Problem Relation Age of Onset  . Thyroid disease Mother     History  Substance Use Topics  . Smoking status: Never Smoker   . Smokeless tobacco: Not on file  . Alcohol Use: No     Comment: denies use 03/12/12      Review of Systems  Constitutional: Negative for fever.       10 Systems reviewed and are negative for acute change except as noted in the HPI.  HENT: Positive for dental problem. Negative for congestion.   Eyes: Negative for discharge and redness.  Respiratory: Negative for cough and shortness of breath.   Cardiovascular: Negative for chest pain.  Gastrointestinal: Negative for vomiting and abdominal pain.  Musculoskeletal: Negative for back pain.  Skin: Negative for rash.  Neurological: Negative for syncope, numbness and headaches.  Psychiatric/Behavioral:       No behavior change.    Allergies  Hydrocodone  Home Medications   Current Outpatient Rx  Name  Route  Sig  Dispense  Refill  . ACETAMINOPHEN-CODEINE #3 300-30 MG PO TABS   Oral   Take 1 tablet by mouth every 4 (four) hours as needed for pain.   20 tablet   0     Please take with food   . BACLOFEN 10 MG PO TABS   Oral   Take 1 tablet (10 mg total)  by mouth 3 (three) times daily.   21 each   0   . OXYCODONE-ACETAMINOPHEN 5-325 MG PO TABS   Oral   Take 1 tablet by mouth every 4 (four) hours as needed for pain.   15 tablet   0     BP 128/78  Pulse 65  Temp 97.3 F (36.3 C) (Oral)  Resp 16  Ht 5\' 11"  (1.803 m)  Wt 150 lb (68.04 kg)  BMI 20.92 kg/m2  SpO2 100%  Physical Exam  Nursing note and vitals reviewed. Constitutional:       Awake, alert, nontoxic appearance.  HENT:  Head: Atraumatic.       1st molar on right upper with missing filling and back part of tooth. 1st molar on the left upper with small piece missing from the back side of the tooth. No abscess noted. Gums without swelling.  Eyes: Right eye exhibits no discharge. Left eye exhibits no discharge.  Neck: Neck supple.  Pulmonary/Chest: Effort normal. He exhibits no tenderness.  Abdominal: Soft. There is no tenderness. There is no rebound.  Musculoskeletal: He exhibits no tenderness.       Baseline ROM, no obvious new focal weakness.  Neurological:       Mental status and motor strength appears  baseline for patient and situation.  Skin: No rash noted.  Psychiatric: He has a normal mood and affect.    ED Course  Procedures (including critical care time)     MDM  Patient with two teeth that were broken eating a candy cane. Given ibuprofen and analgesic. Referal to dentist. Pt stable in ED with no significant deterioration in condition.The patient appears reasonably screened and/or stabilized for discharge and I doubt any other medical condition or other [facility] requiring further screening, evaluation, or treatment in the ED at this time prior to discharge.  MDM Reviewed: nursing note and vitals           Nicoletta Dress. Colon Branch, MD 03/26/12 [phone]

## 2012-03-26 NOTE — ED Notes (Addendum)
Pt reports dental pain on the right & left upper teeth. Pt has cap to come off one & a cavity in the other.

## 2012-04-22 ENCOUNTER — Encounter (HOSPITAL_COMMUNITY): Payer: Self-pay | Admitting: Emergency Medicine

## 2012-04-22 ENCOUNTER — Emergency Department (HOSPITAL_COMMUNITY)
Admission: EM | Admit: 2012-04-22 | Discharge: 2012-04-22 | Disposition: A | Discharge status: Home / Self Care | Payer: Self-pay | Attending: Emergency Medicine | Admitting: Emergency Medicine

## 2012-04-22 DIAGNOSIS — J45909 Unspecified asthma, uncomplicated: Secondary | ICD-10-CM | POA: Insufficient documentation

## 2012-04-22 DIAGNOSIS — K089 Disorder of teeth and supporting structures, unspecified: Secondary | ICD-10-CM | POA: Insufficient documentation

## 2012-04-22 DIAGNOSIS — K029 Dental caries, unspecified: Secondary | ICD-10-CM | POA: Insufficient documentation

## 2012-04-22 DIAGNOSIS — K0889 Other specified disorders of teeth and supporting structures: Secondary | ICD-10-CM

## 2012-04-22 MED ORDER — OXYCODONE-ACETAMINOPHEN 5-325 MG PO TABS: 1.0000 | ORAL_TABLET | ORAL | Status: AC | PRN

## 2012-04-22 MED ORDER — AMOXICILLIN 500 MG PO CAPS: 500.0000 mg | ORAL_CAPSULE | Freq: Three times a day (TID) | ORAL | Status: DC

## 2012-04-22 MED ORDER — OXYCODONE-ACETAMINOPHEN 5-325 MG PO TABS
1.0000 | ORAL_TABLET | Freq: Once | ORAL | Status: AC
  Administered 2012-04-22: 1 via ORAL
  Filled 2012-04-22: qty 1

## 2012-04-22 MED ORDER — AMOXICILLIN 250 MG PO CAPS
500.0000 mg | ORAL_CAPSULE | Freq: Once | ORAL | Status: AC
  Administered 2012-04-22: 500 mg via ORAL
  Filled 2012-04-22: qty 2

## 2012-04-22 NOTE — ED Notes (Signed)
Patient c/o right upper tooth pain since Thursday.

## 2012-04-22 NOTE — ED Provider Notes (Signed)
History     CSN: [id number]  Arrival date & time 04/22/12  1542   First MD Initiated Contact with Patient 04/22/12 1706      Chief Complaint  Patient presents with  . Dental Pain    (Consider location/radiation/quality/duration/timing/severity/associated sxs/prior treatment) Patient is a 27 y.o. male presenting with tooth pain. The history is provided by the patient.  Dental PainThe primary symptoms include mouth pain. Primary symptoms do not include dental injury, oral bleeding, oral lesions, headaches, fever, shortness of breath, sore throat, angioedema or cough. The symptoms began 3 to 5 days ago. The symptoms are worsening. The symptoms are new. The symptoms occur constantly.  Mouth pain occurs constantly. Mouth pain is unchanged. Affected locations include: teeth and gum(s).  Additional symptoms include: dental sensitivity to temperature and gum tenderness. Additional symptoms do not include: gum swelling, purulent gums, trismus, jaw pain, facial swelling, trouble swallowing, pain with swallowing, drooling and swollen glands. Medical issues include: periodontal disease. Medical issues do not include: smoking.    Past Medical History  Diagnosis Date  . Asthma     Past Surgical History  Procedure Date  . Hernia repair   . Hernia     Family History  Problem Relation Age of Onset  . Thyroid disease Mother     History  Substance Use Topics  . Smoking status: Never Smoker   . Smokeless tobacco: Not on file  . Alcohol Use: No     Comment: denies use 03/12/12      Review of Systems  Constitutional: Negative for fever and appetite change.  HENT: Positive for dental problem. Negative for congestion, sore throat, facial swelling, drooling, trouble swallowing, neck pain and neck stiffness.   Eyes: Negative for pain and visual disturbance.  Respiratory: Negative for cough and shortness of breath.   Neurological: Negative for dizziness, facial asymmetry and headaches.    Hematological: Negative for adenopathy.  All other systems reviewed and are negative.    Allergies  Hydrocodone  Home Medications   Current Outpatient Rx  Name  Route  Sig  Dispense  Refill  . ACETAMINOPHEN-CODEINE #3 300-30 MG PO TABS   Oral   Take 1 tablet by mouth every 4 (four) hours as needed for pain.   20 tablet   0     Please take with food   . BACLOFEN 10 MG PO TABS   Oral   Take 1 tablet (10 mg total) by mouth 3 (three) times daily.   21 each   0   . IBUPROFEN 800 MG PO TABS   Oral   Take 1 tablet (800 mg total) by mouth 3 (three) times daily.   21 tablet   0   . OXYCODONE-ACETAMINOPHEN 5-325 MG PO TABS   Oral   Take 1 tablet by mouth every 4 (four) hours as needed for pain.   15 tablet   0     BP 171/84  Pulse 104  Temp 97.8 F (36.6 C) (Oral)  Resp 24  Ht 5\' 11"  (1.803 m)  Wt 153 lb (69.4 kg)  BMI 21.34 kg/m2  SpO2 100%  Physical Exam  Nursing note and vitals reviewed. Constitutional: He is oriented to person, place, and time. He appears well-developed and well-nourished. No distress.  HENT:  Head: Normocephalic and atraumatic. No trismus in the jaw.  Right Ear: Tympanic membrane and ear canal normal.  Left Ear: Tympanic membrane and ear canal normal.  Mouth/Throat: Uvula is midline, oropharynx is clear and  moist and mucous membranes are normal. Dental caries present. No dental abscesses or uvula swelling.         ttp of the right upper first molar.  Dental decay also present.  No erythema or obvious dental abscess seen.    Neck: Normal range of motion. Neck supple.  Cardiovascular: Normal rate, regular rhythm and normal heart sounds.   No murmur heard. Pulmonary/Chest: Effort normal and breath sounds normal.  Musculoskeletal: Normal range of motion.  Lymphadenopathy:    He has no cervical adenopathy.  Neurological: He is alert and oriented to person, place, and time. He exhibits normal muscle tone. Coordination normal.  Skin: Skin is  warm and dry.    ED Course  Procedures (including critical care time)  Labs Reviewed - No data to display No results found.      MDM    Previous ed chart reviewed.    ttp of the right upper first molar.  Dental decay present w/o obvious dental abscess, facial swelling or trismus  Patient reviewed on [place] narcotics database.  No recent narcotic prescriptions   Pt agrees to f/u with a dentist.  Advised he will need dental f/u for further managment   Prescribed: Amoxil Percocet #12  Sofiah Lyne L. [place], [place] 04/22/12 2354

## 2012-04-23 NOTE — ED Provider Notes (Signed)
Medical screening examination/treatment/procedure(s) were performed by non-physician practitioner and as supervising physician I was immediately available for consultation/collaboration.   Flint Melter, MD 04/23/12 [phone]

## 2012-04-25 ENCOUNTER — Emergency Department (HOSPITAL_COMMUNITY)
Admission: EM | Admit: 2012-04-25 | Discharge: 2012-04-25 | Disposition: A | Discharge status: Home / Self Care | Payer: No Typology Code available for payment source | Attending: Emergency Medicine | Admitting: Emergency Medicine

## 2012-04-25 ENCOUNTER — Encounter (HOSPITAL_COMMUNITY): Payer: Self-pay | Admitting: Emergency Medicine

## 2012-04-25 ENCOUNTER — Emergency Department (HOSPITAL_COMMUNITY): Payer: No Typology Code available for payment source

## 2012-04-25 DIAGNOSIS — Y92410 Unspecified street and highway as the place of occurrence of the external cause: Secondary | ICD-10-CM | POA: Insufficient documentation

## 2012-04-25 DIAGNOSIS — IMO0002 Reserved for concepts with insufficient information to code with codable children: Secondary | ICD-10-CM | POA: Insufficient documentation

## 2012-04-25 DIAGNOSIS — Y9389 Activity, other specified: Secondary | ICD-10-CM | POA: Insufficient documentation

## 2012-04-25 DIAGNOSIS — Z79899 Other long term (current) drug therapy: Secondary | ICD-10-CM | POA: Insufficient documentation

## 2012-04-25 DIAGNOSIS — M62838 Other muscle spasm: Secondary | ICD-10-CM | POA: Insufficient documentation

## 2012-04-25 DIAGNOSIS — J45909 Unspecified asthma, uncomplicated: Secondary | ICD-10-CM | POA: Insufficient documentation

## 2012-04-25 MED ORDER — METHOCARBAMOL 500 MG PO TABS
1000.0000 mg | ORAL_TABLET | Freq: Once | ORAL | Status: AC
  Administered 2012-04-25: 1000 mg via ORAL
  Filled 2012-04-25: qty 2

## 2012-04-25 MED ORDER — IBUPROFEN 800 MG PO TABS
800.0000 mg | ORAL_TABLET | Freq: Once | ORAL | Status: AC
  Administered 2012-04-25: 800 mg via ORAL
  Filled 2012-04-25: qty 1

## 2012-04-25 MED ORDER — IBUPROFEN 600 MG PO TABS: 600.0000 mg | ORAL_TABLET | Freq: Four times a day (QID) | ORAL | Status: AC | PRN

## 2012-04-25 MED ORDER — METHOCARBAMOL 500 MG PO TABS: 1000.0000 mg | ORAL_TABLET | Freq: Four times a day (QID) | ORAL | Status: AC

## 2012-04-25 NOTE — ED Notes (Signed)
Idol, pa at bedside.
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
## 2012-04-29 NOTE — ED Provider Notes (Signed)
Medical screening examination/treatment/procedure(s) were performed by non-physician practitioner and as supervising physician I was immediately available for consultation/collaboration.   Dj Senteno, MD 04/29/12 1657 
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
## 2012-05-12 ENCOUNTER — Emergency Department (HOSPITAL_COMMUNITY): Payer: Self-pay

## 2012-05-12 ENCOUNTER — Emergency Department (HOSPITAL_COMMUNITY)
Admission: EM | Admit: 2012-05-12 | Discharge: 2012-05-12 | Disposition: A | Discharge status: Home / Self Care | Payer: Self-pay | Attending: Emergency Medicine | Admitting: Emergency Medicine

## 2012-05-12 ENCOUNTER — Encounter (HOSPITAL_COMMUNITY): Payer: Self-pay | Admitting: Emergency Medicine

## 2012-05-12 DIAGNOSIS — Y9289 Other specified places as the place of occurrence of the external cause: Secondary | ICD-10-CM | POA: Insufficient documentation

## 2012-05-12 DIAGNOSIS — W208XXA Other cause of strike by thrown, projected or falling object, initial encounter: Secondary | ICD-10-CM | POA: Insufficient documentation

## 2012-05-12 DIAGNOSIS — S60222A Contusion of left hand, initial encounter: Secondary | ICD-10-CM

## 2012-05-12 DIAGNOSIS — Y9389 Activity, other specified: Secondary | ICD-10-CM | POA: Insufficient documentation

## 2012-05-12 DIAGNOSIS — Y990 Civilian activity done for income or pay: Secondary | ICD-10-CM | POA: Insufficient documentation

## 2012-05-12 DIAGNOSIS — S60229A Contusion of unspecified hand, initial encounter: Secondary | ICD-10-CM | POA: Insufficient documentation

## 2012-05-12 DIAGNOSIS — J45909 Unspecified asthma, uncomplicated: Secondary | ICD-10-CM | POA: Insufficient documentation

## 2012-05-12 MED ORDER — OXYCODONE-ACETAMINOPHEN 5-325 MG PO TABS
2.0000 | ORAL_TABLET | Freq: Once | ORAL | Status: AC
  Administered 2012-05-12: 2 via ORAL
  Filled 2012-05-12: qty 2

## 2012-05-12 MED ORDER — OXYCODONE-ACETAMINOPHEN 5-325 MG PO TABS: 1.0000 | ORAL_TABLET | ORAL | Status: DC | PRN

## 2012-05-12 MED ORDER — NAPROXEN 500 MG PO TABS: 500.0000 mg | ORAL_TABLET | Freq: Two times a day (BID) | ORAL | Status: DC
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
## 2012-05-12 NOTE — ED Provider Notes (Signed)
History     This chart was scribed for Vida Roller, MD, MD by Smitty Pluck, ED Scribe. The patient was seen in room APFT23/APFT23 and the patient's care was started at 3:06 PM.   CSN: [id number]  Arrival date & time 05/12/12  1249     Chief Complaint  Patient presents with  . Hand Pain    (Consider location/radiation/quality/duration/timing/severity/associated sxs/prior treatment) The history is provided by the patient. No language interpreter was used.   Marcus Mora is a 27 y.o. male who presents to the Emergency Department complaining of constant, moderate left hand pain onset today. Pt reports that he was working on car and the car fell off of carjack causing the axle to fall on him and entire weight of car to bear on his left hand. He denies numbness in left hand, loss of function in left hand and any other pain/  Past Medical History  Diagnosis Date  . Asthma     Past Surgical History  Procedure Laterality Date  . Hernia repair    . Hernia      Family History  Problem Relation Age of Onset  . Thyroid disease Mother     History  Substance Use Topics  . Smoking status: Never Smoker   . Smokeless tobacco: Not on file  . Alcohol Use: No     Comment: denies use 03/12/12      Review of Systems  Constitutional: Negative for fever and chills.  Respiratory: Negative for shortness of breath.   Gastrointestinal: Negative for nausea and vomiting.  Neurological: Negative for weakness.  All other systems reviewed and are negative.    Allergies  Hydrocodone  Home Medications   Current Outpatient Rx  Name  Route  Sig  Dispense  Refill  . acetaminophen (TYLENOL) 500 MG tablet   Oral   Take 1,000 mg by mouth every 6 (six) hours as needed. Pain.         Marland Kitchen amoxicillin (AMOXIL) 500 MG capsule   Oral   Take 1 capsule (500 mg total) by mouth 3 (three) times daily.   30 capsule   0   . naproxen (NAPROSYN) 500 MG tablet   Oral   Take 1 tablet (500 mg  total) by mouth 2 (two) times daily with a meal.   30 tablet   0   . oxyCODONE-acetaminophen (PERCOCET) 5-325 MG per tablet   Oral   Take 1 tablet by mouth every 4 (four) hours as needed for pain.   20 tablet   0     BP 146/92  Pulse 90  Temp(Src) 97.9 F (36.6 C) (Oral)  Resp 16  SpO2 100%  Physical Exam  Nursing note and vitals reviewed. Constitutional: He is oriented to person, place, and time. He appears well-developed and well-nourished. No distress.  HENT:  Head: Normocephalic and atraumatic.  Eyes: EOM are normal.  Neck: Neck supple. No tracheal deviation present.  Cardiovascular: Normal rate.   Pulmonary/Chest: Effort normal. No respiratory distress.  Musculoskeletal: Normal range of motion.  Good cap refill in left hand  Good pulse at radial artery Bruising to entire dorsum of left hand with swelling No lacerations Palmar surface of left hand nl No injuries distal to left MCPs or left wrist  Neurological: He is alert and oriented to person, place, and time.  Skin: Skin is warm and dry.  Psychiatric: He has a normal mood and affect. His behavior is normal.    ED Course  Procedures (including critical care time) DIAGNOSTIC STUDIES: Oxygen Saturation is 100% on room air, normal by my interpretation.    COORDINATION OF CARE: 3:09 PM Discussed ED treatment with pt and pt agrees.     Labs Reviewed - No data to display Dg Hand Complete Left  05/12/2012  *RADIOLOGY REPORT*  Clinical Data: 26 year old male with left hand pain following injury.  LEFT HAND - COMPLETE 3+ VIEW  Comparison: 09/03/2011  Findings: No evidence of acute fracture, subluxation or dislocation identified.  No radio-opaque foreign bodies are present.  No focal bony lesions are noted.  The joint spaces are unremarkable.  Dorsal soft tissue swelling is noted.  IMPRESSION: Soft tissue swelling without bony abnormality.   Original Report Authenticated By: Harmon Pier, M.D.      1. Contusion of  hand, left       MDM  The patient has a significant contusion to the back of the left hand, he has no other significant findings, his x-ray show no signs of fracture, I have recommended rice therapy and followup, pain medication given, home with instructions and followup precautions, patient has expressed his understanding.    I personally performed the services described in this documentation, which was scribed in my presence. The recorded information has been reviewed and is accurate.        Vida Roller, MD 05/12/12 [phone]

## 2012-10-23 ENCOUNTER — Encounter (HOSPITAL_COMMUNITY): Payer: Self-pay | Admitting: *Deleted

## 2012-10-23 ENCOUNTER — Emergency Department (HOSPITAL_COMMUNITY)
Admission: EM | Admit: 2012-10-23 | Discharge: 2012-10-23 | Disposition: A | Discharge status: Home / Self Care | Payer: Self-pay | Attending: Emergency Medicine | Admitting: Emergency Medicine

## 2012-10-23 DIAGNOSIS — K047 Periapical abscess without sinus: Secondary | ICD-10-CM | POA: Insufficient documentation

## 2012-10-23 DIAGNOSIS — X58XXXA Exposure to other specified factors, initial encounter: Secondary | ICD-10-CM | POA: Insufficient documentation

## 2012-10-23 DIAGNOSIS — Y9389 Activity, other specified: Secondary | ICD-10-CM | POA: Insufficient documentation

## 2012-10-23 DIAGNOSIS — J45909 Unspecified asthma, uncomplicated: Secondary | ICD-10-CM | POA: Insufficient documentation

## 2012-10-23 DIAGNOSIS — Z79899 Other long term (current) drug therapy: Secondary | ICD-10-CM | POA: Insufficient documentation

## 2012-10-23 DIAGNOSIS — Y929 Unspecified place or not applicable: Secondary | ICD-10-CM | POA: Insufficient documentation

## 2012-10-23 DIAGNOSIS — S025XXA Fracture of tooth (traumatic), initial encounter for closed fracture: Secondary | ICD-10-CM | POA: Insufficient documentation

## 2012-10-23 MED ORDER — AMOXICILLIN 500 MG PO CAPS: 500.0000 mg | ORAL_CAPSULE | Freq: Three times a day (TID) | ORAL | Status: DC

## 2012-10-23 MED ORDER — OXYCODONE-ACETAMINOPHEN 5-325 MG PO TABS: 1.0000 | ORAL_TABLET | ORAL | Status: DC | PRN

## 2012-10-23 MED ORDER — OXYCODONE-ACETAMINOPHEN 5-325 MG PO TABS: 1.0000 | ORAL_TABLET | Freq: Once | ORAL | Status: DC

## 2012-10-23 MED ORDER — OXYCODONE-ACETAMINOPHEN 5-325 MG PO TABS
ORAL_TABLET | ORAL | Status: AC
  Filled 2012-10-23: qty 1

## 2012-10-23 NOTE — ED Notes (Signed)
C/o left upper tooth pain onset today - states was at work today, bit into a piece of chicken, then half his tooth broke.

## 2012-10-23 NOTE — ED Notes (Signed)
Oxycodone pulled and given to J. Idol, EDPa for admin to pt for home use.

## 2012-10-24 NOTE — ED Provider Notes (Signed)
CSN: [id number]     Arrival date & time 10/23/12  2043 History     First MD Initiated Contact with Patient 10/23/12 2104     Chief Complaint  Patient presents with  . Dental Pain   (Consider location/radiation/quality/duration/timing/severity/associated sxs/prior Treatment) Patient is a 27 y.o. male presenting with tooth pain. The history is provided by the patient.  Dental Pain Location:  Upper Upper teeth location:  14/LU 1st molar Quality:  Sharp and throbbing Severity:  Moderate Onset quality:  Sudden Duration:  9 hours Timing:  Constant Progression:  Worsening Chronicity:  New Context: enamel fracture   Relieved by:  Nothing Worsened by:  Hot food/drink and cold food/drink Ineffective treatments:  Acetaminophen Associated symptoms: no difficulty swallowing, no drooling, no facial pain, no facial swelling, no fever, no gum swelling, no neck pain and no neck swelling     Past Medical History  Diagnosis Date  . Asthma    Past Surgical History  Procedure Laterality Date  . Hernia repair    . Hernia     Family History  Problem Relation Age of Onset  . Thyroid disease Mother    History  Substance Use Topics  . Smoking status: Never Smoker   . Smokeless tobacco: Not on file  . Alcohol Use: No     Comment: denies use 03/12/12    Review of Systems  Constitutional: Negative for fever.  HENT: Positive for dental problem. Negative for sore throat, facial swelling, drooling, neck pain and neck stiffness.   Respiratory: Negative for shortness of breath.     Allergies  Hydrocodone  Home Medications   Current Outpatient Rx  Name  Route  Sig  Dispense  Refill  . acetaminophen (TYLENOL) 500 MG tablet   Oral   Take 1,000 mg by mouth every 6 (six) hours as needed. Pain.         Marland Kitchen amoxicillin (AMOXIL) 500 MG capsule   Oral   Take 1 capsule (500 mg total) by mouth 3 (three) times daily.   21 capsule   0   . oxyCODONE-acetaminophen (PERCOCET/ROXICET) 5-325 MG  per tablet   Oral   Take 1 tablet by mouth every 4 (four) hours as needed for pain.   15 tablet   0    BP 139/85  Pulse 73  Temp(Src) 98.1 F (36.7 C) (Oral)  Resp 19  Ht 5\' 11"  (1.803 m)  Wt 154 lb 8 oz (70.081 kg)  BMI 21.56 kg/m2  SpO2 100% Physical Exam  Constitutional: He is oriented to person, place, and time. He appears well-developed and well-nourished. No distress.  HENT:  Head: Normocephalic and atraumatic.  Right Ear: Tympanic membrane and external ear normal.  Left Ear: Tympanic membrane and external ear normal.  Mouth/Throat: Oropharynx is clear and moist and mucous membranes are normal. No oral lesions. Dental abscesses present.    Dental fracture anterior left upper 1st molar.  No surrounding gingival edema.   Eyes: Conjunctivae are normal.  Neck: Normal range of motion. Neck supple.  Cardiovascular: Normal rate and normal heart sounds.   Pulmonary/Chest: Effort normal.  Abdominal: He exhibits no distension.  Musculoskeletal: Normal range of motion.  Lymphadenopathy:    He has no cervical adenopathy.  Neurological: He is alert and oriented to person, place, and time.  Skin: Skin is warm and dry. No erythema.  Psychiatric: He has a normal mood and affect.    ED Course   Procedures (including critical care time)  Labs Reviewed -  No data to display No results found. 1. Broken tooth, closed, initial encounter     MDM  Pt was prescribed oxycodone, he was also prescribed amoxil,  But advised to get filled and take only if he develops worse pain,  Gingival swelling or drainage from the tooth as there is no current signs of infection,  He was given dental referral list as well.  Burgess Amor, PA-C 10/24/12 [phone]

## 2012-10-24 NOTE — ED Provider Notes (Signed)
Medical screening examination/treatment/procedure(s) were performed by non-physician practitioner and as supervising physician I was immediately available for consultation/collaboration.   Kiyon Fidalgo M Mikaela Hilgeman, DO 10/24/12 1633 

## 2012-10-31 ENCOUNTER — Emergency Department (HOSPITAL_COMMUNITY): Payer: Self-pay

## 2012-10-31 ENCOUNTER — Encounter (HOSPITAL_COMMUNITY): Payer: Self-pay

## 2012-10-31 ENCOUNTER — Emergency Department (HOSPITAL_COMMUNITY)
Admission: EM | Admit: 2012-10-31 | Discharge: 2012-10-31 | Disposition: A | Discharge status: Home / Self Care | Payer: Self-pay | Attending: Emergency Medicine | Admitting: Emergency Medicine

## 2012-10-31 DIAGNOSIS — S60221A Contusion of right hand, initial encounter: Secondary | ICD-10-CM

## 2012-10-31 DIAGNOSIS — Y929 Unspecified place or not applicable: Secondary | ICD-10-CM | POA: Insufficient documentation

## 2012-10-31 DIAGNOSIS — Y9389 Activity, other specified: Secondary | ICD-10-CM | POA: Insufficient documentation

## 2012-10-31 DIAGNOSIS — J45909 Unspecified asthma, uncomplicated: Secondary | ICD-10-CM | POA: Insufficient documentation

## 2012-10-31 DIAGNOSIS — IMO0002 Reserved for concepts with insufficient information to code with codable children: Secondary | ICD-10-CM | POA: Insufficient documentation

## 2012-10-31 DIAGNOSIS — S60229A Contusion of unspecified hand, initial encounter: Secondary | ICD-10-CM | POA: Insufficient documentation

## 2012-10-31 DIAGNOSIS — Z791 Long term (current) use of non-steroidal anti-inflammatories (NSAID): Secondary | ICD-10-CM | POA: Insufficient documentation

## 2012-10-31 MED ORDER — OXYCODONE-ACETAMINOPHEN 5-325 MG PO TABS: 1.0000 | ORAL_TABLET | ORAL | Status: DC | PRN

## 2012-10-31 MED ORDER — IBUPROFEN 800 MG PO TABS: 800.0000 mg | ORAL_TABLET | Freq: Three times a day (TID) | ORAL | Status: DC

## 2012-10-31 MED ORDER — OXYCODONE-ACETAMINOPHEN 5-325 MG PO TABS
1.0000 | ORAL_TABLET | Freq: Once | ORAL | Status: AC
  Administered 2012-10-31: 1 via ORAL
  Filled 2012-10-31: qty 1

## 2012-10-31 NOTE — ED Notes (Signed)
Pt's right hand with swelling after hitting hand with hammer around 1730 this afternoon, ice pack in place at this time

## 2012-10-31 NOTE — ED Notes (Signed)
Accidentally hit hand with hammer. Painful and swollen now

## 2012-11-01 NOTE — ED Provider Notes (Signed)
CSN: [id number]     Arrival date & time 10/31/12  2018 History     First MD Initiated Contact with Patient 10/31/12 2117     Chief Complaint  Patient presents with  . Hand Pain   (Consider location/radiation/quality/duration/timing/severity/associated sxs/prior Treatment) HPI Comments: RAEVON BROOM is a 27 y.o. male who presents to the Emergency Department complaining of pain and swelling to the right hand after a direct blow to the hand while using a hammer.  C/o pain with movement of his hand and most of the pain.    Patient is a 27 y.o. male presenting with hand pain. The history is provided by the patient.  Hand Pain This is a new problem. The problem occurs constantly. The problem has been unchanged. Associated symptoms include arthralgias and joint swelling. Pertinent negatives include no chills, fever, neck pain, numbness, vomiting or weakness. The symptoms are aggravated by bending (movement and palpation). He has tried acetaminophen for the symptoms. The treatment provided no relief.    Past Medical History  Diagnosis Date  . Asthma    Past Surgical History  Procedure Laterality Date  . Hernia repair    . Hernia     Family History  Problem Relation Age of Onset  . Thyroid disease Mother    History  Substance Use Topics  . Smoking status: Never Smoker   . Smokeless tobacco: Not on file  . Alcohol Use: No     Comment: denies use 03/12/12    Review of Systems  Constitutional: Negative for fever and chills.  HENT: Negative for neck pain.   Gastrointestinal: Negative for vomiting.  Genitourinary: Negative for dysuria and difficulty urinating.  Musculoskeletal: Positive for joint swelling and arthralgias.  Skin: Negative for color change and wound.  Neurological: Negative for dizziness, weakness and numbness.  All other systems reviewed and are negative.    Allergies  Hydrocodone  Home Medications   Current Outpatient Rx  Name  Route  Sig  Dispense   Refill  . acetaminophen (TYLENOL) 500 MG tablet   Oral   Take 1,000 mg by mouth every 6 (six) hours as needed. Pain.         Marland Kitchen ibuprofen (ADVIL,MOTRIN) 800 MG tablet   Oral   Take 1 tablet (800 mg total) by mouth 3 (three) times daily.   21 tablet   0   . oxyCODONE-acetaminophen (PERCOCET/ROXICET) 5-325 MG per tablet   Oral   Take 1 tablet by mouth every 4 (four) hours as needed for pain.   8 tablet   0    BP 121/87  Pulse 63  Temp(Src) 97.7 F (36.5 C) (Oral)  Resp 24  Ht 5\' 10"  (1.778 m)  Wt 155 lb (70.308 kg)  BMI 22.24 kg/m2  SpO2 100% Physical Exam  Nursing note and vitals reviewed. Constitutional: He is oriented to person, place, and time. He appears well-developed and well-nourished. No distress.  HENT:  Head: Normocephalic and atraumatic.  Cardiovascular: Normal rate, regular rhythm, normal heart sounds and intact distal pulses.   No murmur heard. Pulmonary/Chest: Effort normal and breath sounds normal. No respiratory distress.  Musculoskeletal: He exhibits edema and tenderness.       Right hand: He exhibits tenderness, bony tenderness and swelling. He exhibits normal range of motion, normal two-point discrimination, normal capillary refill, no deformity and no laceration. Normal sensation noted. Normal strength noted.       Hands: Localized STS of the dorsal right hand with ttp of the  head of the second and third metacarpals.  No open wounds, abrasions or bony deformity.  Radial pulse is brisk, distal sensation intact,  CR< 2 sec.  No proximal tenderness. Compartments are soft  Neurological: He is alert and oriented to person, place, and time. He exhibits normal muscle tone. Coordination normal.  Skin: Skin is warm and dry.    ED Course   Procedures (including critical care time)  Labs Reviewed - No data to display Dg Hand Complete Right  10/31/2012   *RADIOLOGY REPORT*  Clinical Data: Injury, pain  RIGHT HAND - COMPLETE 3+ VIEW  Comparison: 03/12/2012   Findings: Normal alignment.  Negative fracture.  Preserved joint spaces.  Incidental bone island in the distal radius.  Mild soft tissue swelling dorsally on the lateral view.  IMPRESSION: Soft tissue swelling.  No acute osseous finding   Original Report Authenticated By: Judie Petit. Shick, M.D.   1. Contusion, hand, right, initial encounter     MDM    Bulky dressing applied to the hand.  Patient agrees to elevate, ice and f/u with Dr. Romeo Apple if the sx's are not improving.    VSS.  Patient is stable for discharge.    Chasya Zenz L. Trisha Mangle, PA-C 11/01/12 0981

## 2012-11-03 NOTE — ED Provider Notes (Signed)
Medical screening examination/treatment/procedure(s) were performed by non-physician practitioner and as supervising physician I was immediately available for consultation/collaboration.  Jibreel Fedewa M Taijuan Serviss, MD 11/03/12 0711 

## 2013-02-02 IMAGING — CR DG FOREARM 2V*L*
2 series · 2 of 2 positions shown · non-contrast
Comparison: None.

CLINICAL DATA: Injury to left forearm.

LEFT FOREARM - 2 VIEW

[view not recorded (1 of 2)]
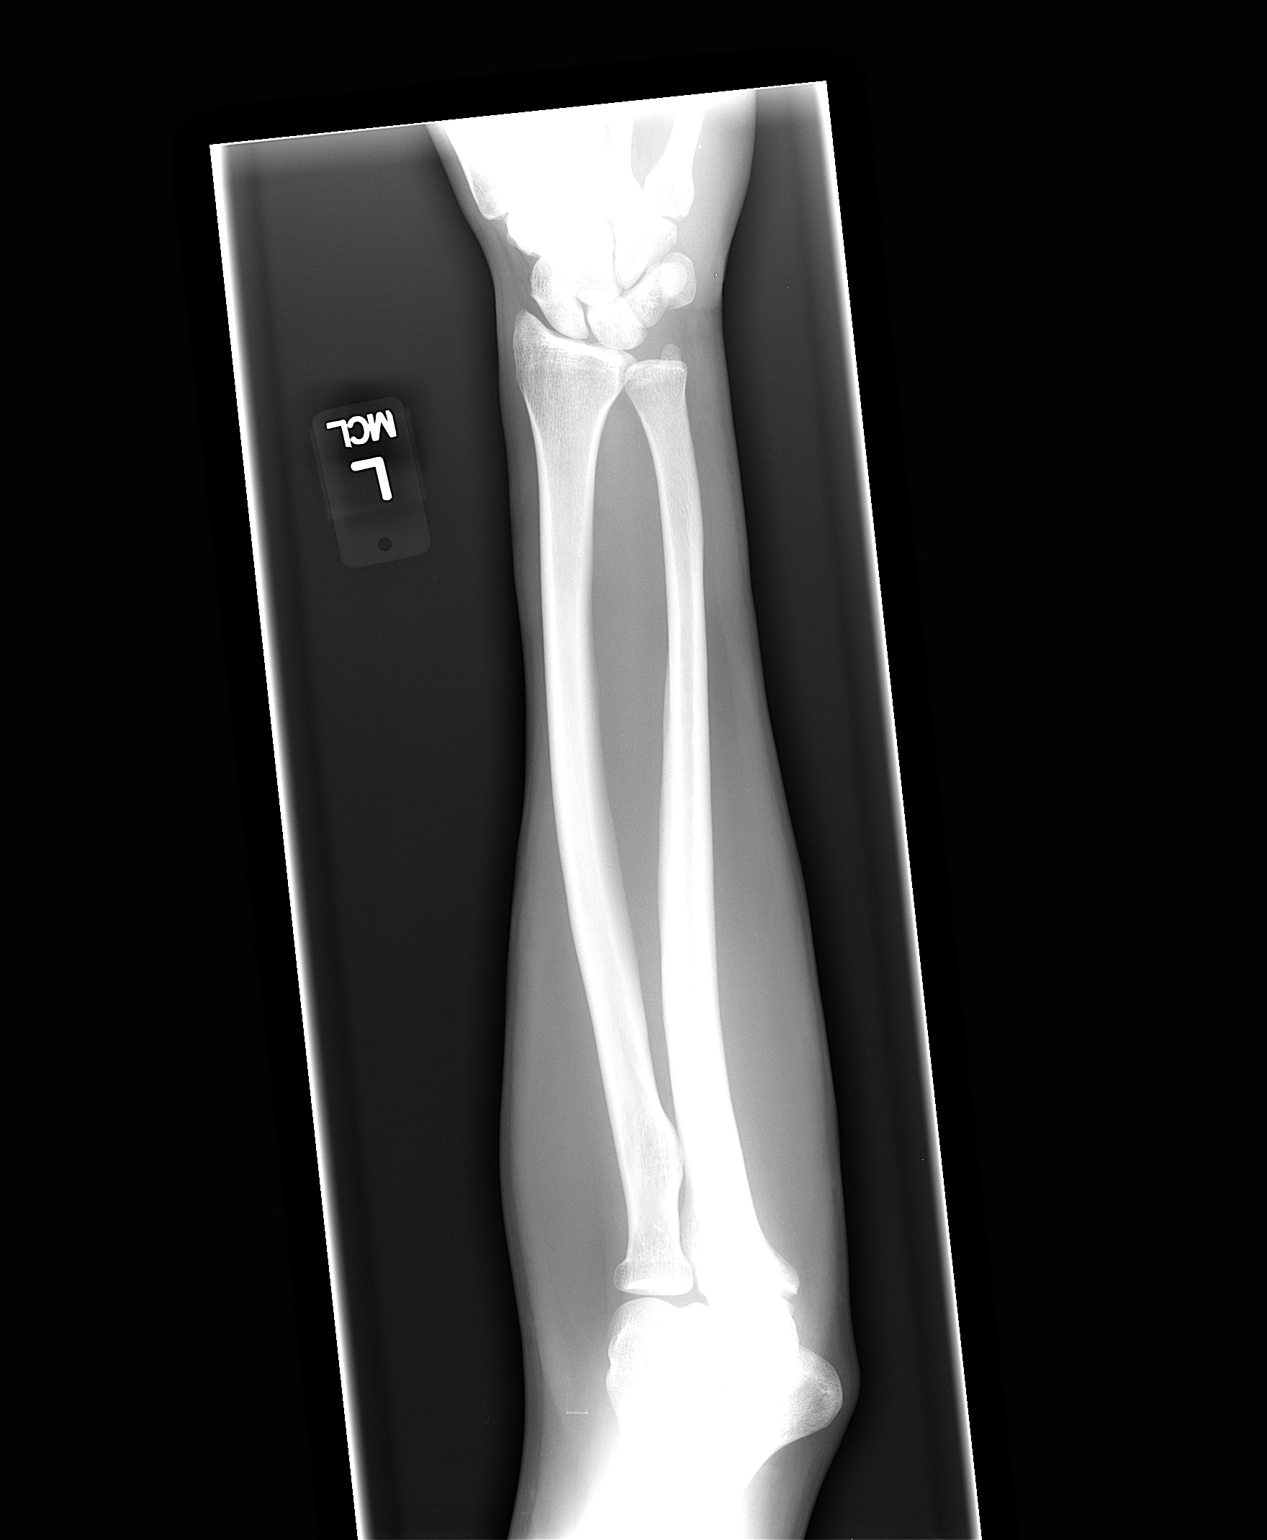

[view not recorded (2 of 2)]
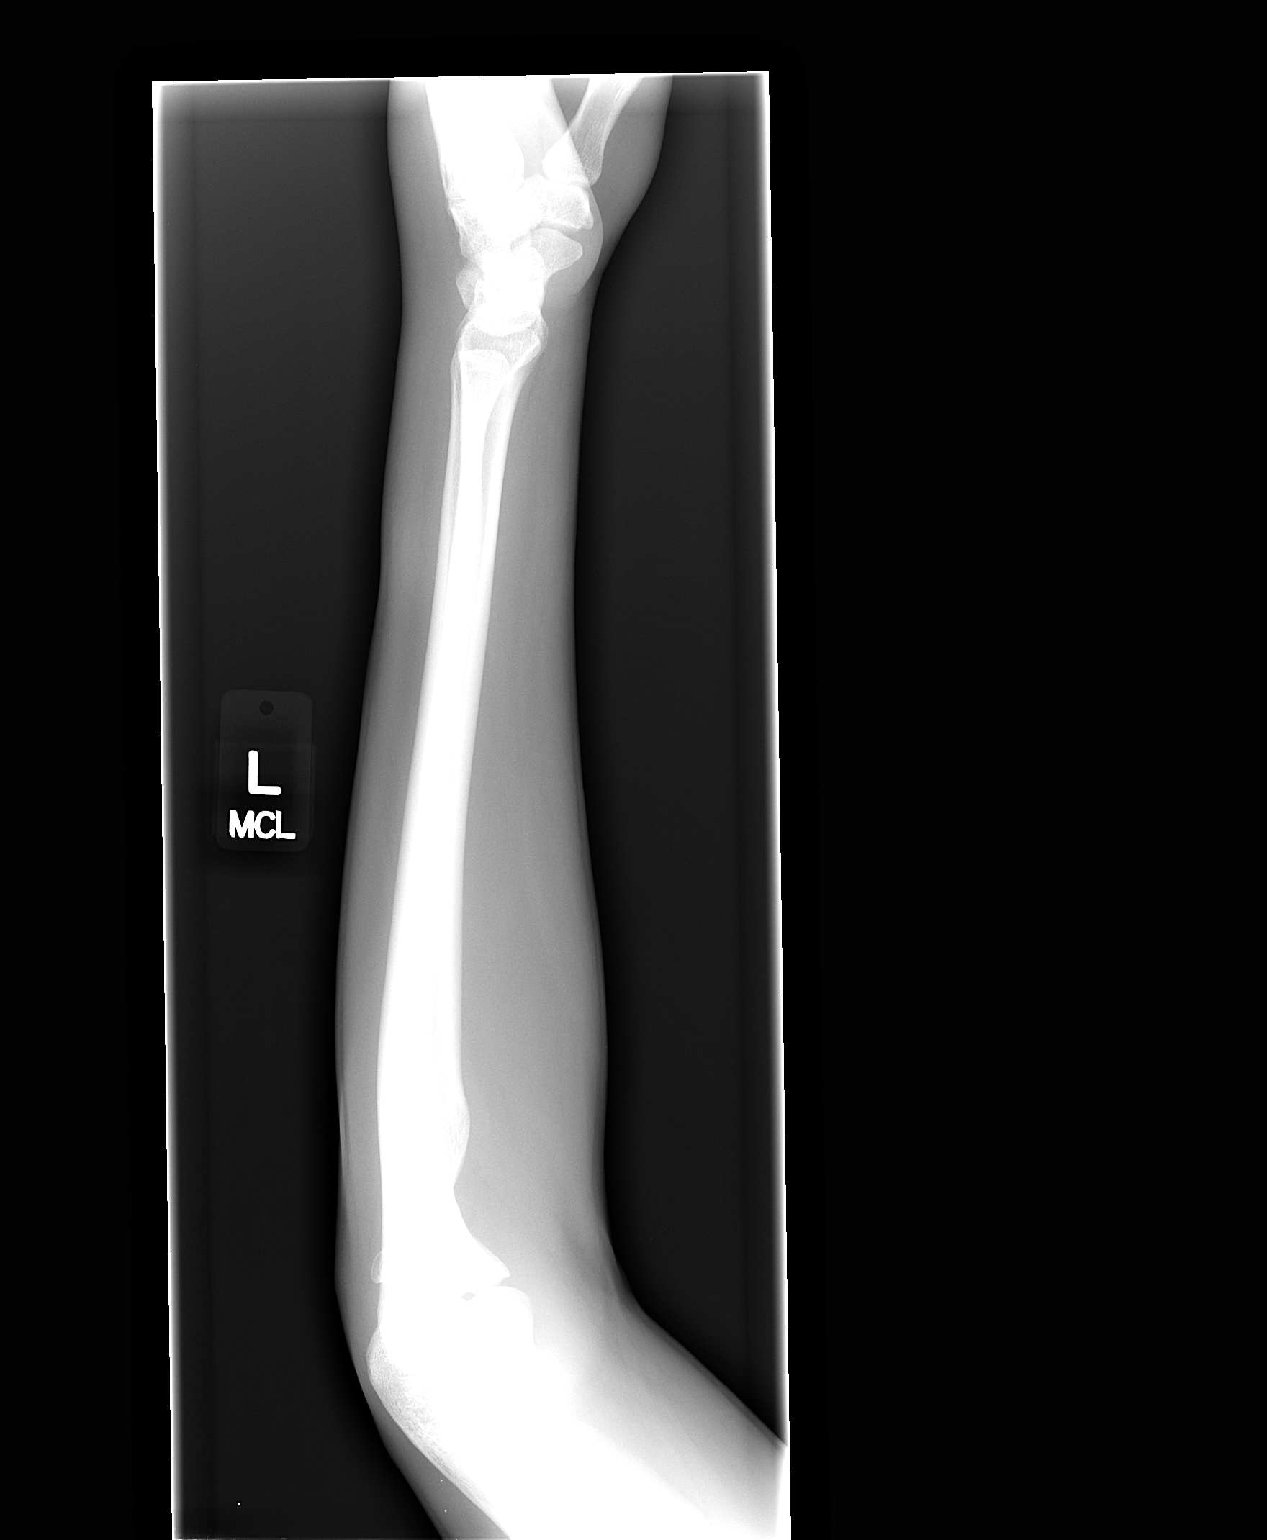

[2 of 2 positions shown; findings below may reference images not displayed]

FINDINGS: No acute fracture or dislocation.  Soft tissues are
unremarkable.
IMPRESSION: No acute findings.

## 2013-03-06 ENCOUNTER — Emergency Department (HOSPITAL_COMMUNITY)
Admission: EM | Admit: 2013-03-06 | Discharge: 2013-03-06 | Disposition: A | Discharge status: Home / Self Care | Payer: No Typology Code available for payment source | Attending: Emergency Medicine | Admitting: Emergency Medicine

## 2013-03-06 ENCOUNTER — Encounter (HOSPITAL_COMMUNITY): Payer: Self-pay | Admitting: Emergency Medicine

## 2013-03-06 DIAGNOSIS — J45909 Unspecified asthma, uncomplicated: Secondary | ICD-10-CM | POA: Insufficient documentation

## 2013-03-06 DIAGNOSIS — K089 Disorder of teeth and supporting structures, unspecified: Secondary | ICD-10-CM | POA: Insufficient documentation

## 2013-03-06 DIAGNOSIS — K0889 Other specified disorders of teeth and supporting structures: Secondary | ICD-10-CM

## 2013-03-06 MED ORDER — IBUPROFEN 800 MG PO TABS
800.0000 mg | ORAL_TABLET | Freq: Once | ORAL | Status: AC
  Administered 2013-03-06: 800 mg via ORAL
  Filled 2013-03-06: qty 1

## 2013-03-06 MED ORDER — PENICILLIN V POTASSIUM 500 MG PO TABS: 500.0000 mg | ORAL_TABLET | Freq: Four times a day (QID) | ORAL | Status: DC

## 2013-03-06 MED ORDER — IBUPROFEN 600 MG PO TABS: 600.0000 mg | ORAL_TABLET | Freq: Three times a day (TID) | ORAL | Status: DC | PRN

## 2013-03-06 NOTE — ED Notes (Signed)
Pt eatinging 2 days ago and filling fell out of upper right tooth, pt states had filling placed in October.

## 2013-03-06 NOTE — ED Provider Notes (Signed)
CSN: [id number]     Arrival date & time 03/06/13  8295 History  This chart was scribed for Lyanne Co, MD by Quintella Reichert, ED scribe.  This patient was seen in room [facility]19/[facility]19 and the patient's care was started at 8:21 AM.   Chief Complaint  Patient presents with  . Dental Pain    rt upper jaw pain, filling fell out    The history is provided by the patient. No language interpreter was used.    HPI Comments: Marcus Mora is a 27 y.o. male who presents to the Emergency Department complaining of severe right upper dental pain.  Pt states he has a filling in that area and 2 days ago while eating the filling fell out.  Since then he has had severe pain to the area.  Pt had the filling placed at a free dental clinic and does not remember who the dentist was. He states he is working on [employer].    Past Medical History  Diagnosis Date  . Asthma     Past Surgical History  Procedure Laterality Date  . Hernia repair    . Hernia      Family History  Problem Relation Age of Onset  . Thyroid disease Mother     History  Substance Use Topics  . Smoking status: Never Smoker   . Smokeless tobacco: Not on file  . Alcohol Use: No     Comment: denies use 03/12/12     Review of Systems A complete 10 system review of systems was obtained and all systems are negative except as noted in the HPI and PMH.    Allergies  Hydrocodone  Home Medications   Current Outpatient Rx  Name  Route  Sig  Dispense  Refill  . acetaminophen (TYLENOL) 500 MG tablet   Oral   Take 1,000 mg by mouth every 6 (six) hours as needed. Pain.         Marland Kitchen ibuprofen (ADVIL,MOTRIN) 800 MG tablet   Oral   Take 1 tablet (800 mg total) by mouth 3 (three) times daily.   21 tablet   0   . oxyCODONE-acetaminophen (PERCOCET/ROXICET) 5-325 MG per tablet   Oral   Take 1 tablet by mouth every 4 (four) hours as needed for pain.   8 tablet   0    BP 152/75  Pulse 88  Temp(Src) 97.4  F (36.3 C) (Oral)  Resp 19  SpO2 97%  Physical Exam  Nursing note and vitals reviewed. Constitutional: He is oriented to person, place, and time. He appears well-developed and well-nourished.  HENT:  Head: Normocephalic.  Right upper 1st molar w/ evidence of dental decay.  No gingival swelling or fluctuance.  Eyes: EOM are normal.  Neck: Normal range of motion.  Pulmonary/Chest: Effort normal.  Abdominal: He exhibits no distension.  Musculoskeletal: Normal range of motion.  Neurological: He is alert and oriented to person, place, and time.  Psychiatric: He has a normal mood and affect.    ED Course  Procedures (including critical care time)  DIAGNOSTIC STUDIES: Oxygen Saturation is 97% on room air, normal by my interpretation.    COORDINATION OF CARE: 8:24 AM-Discussed treatment plan which includes pain medication, antibiotics and dental f/u with pt at bedside and pt agreed to plan.    Labs Review Labs Reviewed - No data to display  Imaging Review No results found.  EKG Interpretation   None       MDM  1. Pain, dental    Dental Pain. Home with antibiotics and pain medicine. Recommend dental follow up. No signs of gingival abscess. Tolerating secretions. Airway patent. No sub lingular swelling     I personally performed the services described in this documentation, which was scribed in my presence. The recorded information has been reviewed and is accurate.       Lyanne Co, MD 03/06/13 [phone]

## 2013-05-29 ENCOUNTER — Encounter (HOSPITAL_COMMUNITY): Payer: Self-pay | Admitting: Emergency Medicine

## 2013-05-29 ENCOUNTER — Emergency Department (HOSPITAL_COMMUNITY)
Admission: EM | Admit: 2013-05-29 | Discharge: 2013-05-29 | Disposition: A | Discharge status: Home / Self Care | Payer: No Typology Code available for payment source | Attending: Emergency Medicine | Admitting: Emergency Medicine

## 2013-05-29 DIAGNOSIS — J45909 Unspecified asthma, uncomplicated: Secondary | ICD-10-CM | POA: Insufficient documentation

## 2013-05-29 DIAGNOSIS — K029 Dental caries, unspecified: Secondary | ICD-10-CM | POA: Insufficient documentation

## 2013-05-29 DIAGNOSIS — K0889 Other specified disorders of teeth and supporting structures: Secondary | ICD-10-CM

## 2013-05-29 DIAGNOSIS — K089 Disorder of teeth and supporting structures, unspecified: Secondary | ICD-10-CM | POA: Insufficient documentation

## 2013-05-29 MED ORDER — OXYCODONE-ACETAMINOPHEN 5-325 MG PO TABS
1.0000 | ORAL_TABLET | Freq: Once | ORAL | Status: AC
  Administered 2013-05-29: 1 via ORAL
  Filled 2013-05-29: qty 1

## 2013-05-29 MED ORDER — OXYCODONE-ACETAMINOPHEN 5-325 MG PO TABS: 1.0000 | ORAL_TABLET | ORAL | Status: DC | PRN

## 2013-05-29 MED ORDER — PENICILLIN V POTASSIUM 500 MG PO TABS: 500.0000 mg | ORAL_TABLET | Freq: Four times a day (QID) | ORAL | Status: AC

## 2013-05-29 MED ORDER — PENICILLIN V POTASSIUM 250 MG PO TABS
500.0000 mg | ORAL_TABLET | Freq: Four times a day (QID) | ORAL | Status: DC
  Administered 2013-05-29: 500 mg via ORAL
  Filled 2013-05-29: qty 2

## 2013-05-29 NOTE — ED Notes (Signed)
Patient states he broke off a piece of his tooth 2 days ago while eating and is complaining of upper left dental pain.

## 2013-05-29 NOTE — Discharge Instructions (Signed)
Dental Pain °Toothache is pain in or around a tooth. It may get worse with chewing or with cold or heat.  °HOME CARE °· Your dentist may use a numbing medicine during treatment. If so, you may need to avoid eating until the medicine wears off. Ask your dentist about this. °· Only take medicine as told by your dentist or doctor. °· Avoid chewing food near the painful tooth until after all treatment is done. Ask your dentist about this. °GET HELP RIGHT AWAY IF:  °· The problem gets worse or new problems appear. °· You have a fever. °· There is redness and puffiness (swelling) of the face, jaw, or neck. °· You cannot open your mouth. °· There is pain in the jaw. °· There is very bad pain that is not helped by medicine. °MAKE SURE YOU:  °· Understand these instructions. °· Will watch your condition. °· Will get help right away if you are not doing well or get worse. °Document Released: 08/31/2007 Document Revised: 06/06/2011 Document Reviewed: 08/31/2007 °[employer]® Patient Information ©2014 [employer]. ° °

## 2013-05-29 NOTE — ED Provider Notes (Signed)
CSN: [id number]     Arrival date & time 05/29/13  2150 History   First MD Initiated Contact with Patient 05/29/13 2201     Chief Complaint  Patient presents with  . Dental Pain     (Consider location/radiation/quality/duration/timing/severity/associated sxs/prior Treatment) Patient is a 28 y.o. male presenting with tooth pain. The history is provided by the patient.  Dental Pain Location:  Upper Upper teeth location:  15/LU 2nd molar Quality:  Aching and throbbing Severity:  Moderate Onset quality:  Gradual Duration:  2 days Timing:  Constant Progression:  Worsening Chronicity:  New Context: dental caries and poor dentition   Context: not abscess, not malocclusion, not recent dental surgery and not trauma   Relieved by:  Nothing Worsened by:  Cold food/drink and hot food/drink Ineffective treatments:  None tried Associated symptoms: facial pain   Associated symptoms: no congestion, no difficulty swallowing, no facial swelling, no fever, no gum swelling, no headaches, no neck pain, no neck swelling, no oral bleeding, no oral lesions and no trismus   Risk factors: lack of dental care and periodontal disease   Risk factors: no diabetes and no smoking     Past Medical History  Diagnosis Date  . Asthma    Past Surgical History  Procedure Laterality Date  . Hernia repair    . Hernia     Family History  Problem Relation Age of Onset  . Thyroid disease Mother    History  Substance Use Topics  . Smoking status: Never Smoker   . Smokeless tobacco: Not on file  . Alcohol Use: No     Comment: denies use 03/12/12    Review of Systems  Constitutional: Negative for fever and appetite change.  HENT: Positive for dental problem. Negative for congestion, facial swelling, mouth sores, sore throat and trouble swallowing.   Eyes: Negative for pain and visual disturbance.  Musculoskeletal: Negative for neck pain and neck stiffness.  Neurological: Negative for dizziness, facial  asymmetry and headaches.  Hematological: Negative for adenopathy.  All other systems reviewed and are negative.      Allergies  Hydrocodone  Home Medications   Current Outpatient Rx  Name  Route  Sig  Dispense  Refill  . acetaminophen (TYLENOL) 500 MG tablet   Oral   Take 1,000 mg by mouth every 6 (six) hours as needed. Pain.         Marland Kitchen ibuprofen (ADVIL,MOTRIN) 600 MG tablet   Oral   Take 1 tablet (600 mg total) by mouth every 8 (eight) hours as needed.   15 tablet   0   . ibuprofen (ADVIL,MOTRIN) 800 MG tablet   Oral   Take 1 tablet (800 mg total) by mouth 3 (three) times daily.   21 tablet   0   . oxyCODONE-acetaminophen (PERCOCET/ROXICET) 5-325 MG per tablet   Oral   Take 1 tablet by mouth every 4 (four) hours as needed for pain.   8 tablet   0   . penicillin v potassium (VEETID) 500 MG tablet   Oral   Take 1 tablet (500 mg total) by mouth 4 (four) times daily.   28 tablet   0    BP 139/82  Pulse 66  Temp(Src) 97.9 F (36.6 C) (Oral)  Resp 16  Ht 5\' 11"  (1.803 m)  Wt 143 lb (64.864 kg)  BMI 19.95 kg/m2  SpO2 98% Physical Exam  Nursing note and vitals reviewed. Constitutional: He is oriented to person, place, and time. He  appears well-developed and well-nourished. No distress.  HENT:  Head: Normocephalic and atraumatic.  Right Ear: Tympanic membrane and ear canal normal.  Left Ear: Tympanic membrane and ear canal normal.  Mouth/Throat: Uvula is midline, oropharynx is clear and moist and mucous membranes are normal. No trismus in the jaw. Dental caries present. No dental abscesses or uvula swelling.  Dental caries of the #15 tooth.  Patient has multiple dental caries and dental dz.  No facial swelling, obvious dental abscess, trismus, or sublingual abnml.    Neck: Normal range of motion. Neck supple.  Cardiovascular: Normal rate, regular rhythm and normal heart sounds.   No murmur heard. Pulmonary/Chest: Effort normal and breath sounds normal. No  respiratory distress.  Musculoskeletal: Normal range of motion.  Lymphadenopathy:    He has no cervical adenopathy.  Neurological: He is alert and oriented to person, place, and time. He exhibits normal muscle tone. Coordination normal.  Skin: Skin is warm and dry.    ED Course  Procedures (including critical care time) Labs Review Labs Reviewed - No data to display Imaging Review No results found.   EKG Interpretation None      MDM   Final diagnoses:  Pain, dental    Pt is well appearing.  VSS.  No concerning sx's for infection to floor of the mouth.  Pt agrees to follow-up with dentist, referral info given.    The patient appears reasonably screened and/or stabilized for discharge and I doubt any other medical condition or other [facility] requiring further screening, evaluation, or treatment in the ED at this time prior to discharge.     Bernard Slayden L. Minahil Quinlivan, PA-C 05/30/13 1313

## 2013-05-30 NOTE — ED Provider Notes (Signed)
Medical screening examination/treatment/procedure(s) were performed by non-physician practitioner and as supervising physician I was immediately available for consultation/collaboration.   EKG Interpretation None        Benny LennertJoseph L Devrin Monforte, MD 05/30/13 [phone]

## 2013-06-12 ENCOUNTER — Emergency Department (HOSPITAL_COMMUNITY)
Admission: EM | Admit: 2013-06-12 | Discharge: 2013-06-13 | Disposition: A | Discharge status: Home / Self Care | Payer: No Typology Code available for payment source | Attending: Emergency Medicine | Admitting: Emergency Medicine

## 2013-06-12 ENCOUNTER — Encounter (HOSPITAL_COMMUNITY): Payer: Self-pay | Admitting: Emergency Medicine

## 2013-06-12 DIAGNOSIS — Y9389 Activity, other specified: Secondary | ICD-10-CM | POA: Insufficient documentation

## 2013-06-12 DIAGNOSIS — Z792 Long term (current) use of antibiotics: Secondary | ICD-10-CM | POA: Insufficient documentation

## 2013-06-12 DIAGNOSIS — S39012A Strain of muscle, fascia and tendon of lower back, initial encounter: Secondary | ICD-10-CM

## 2013-06-12 DIAGNOSIS — S335XXA Sprain of ligaments of lumbar spine, initial encounter: Secondary | ICD-10-CM | POA: Insufficient documentation

## 2013-06-12 DIAGNOSIS — Z79899 Other long term (current) drug therapy: Secondary | ICD-10-CM | POA: Insufficient documentation

## 2013-06-12 DIAGNOSIS — T50B95A Adverse effect of other viral vaccines, initial encounter: Secondary | ICD-10-CM | POA: Insufficient documentation

## 2013-06-12 DIAGNOSIS — J45909 Unspecified asthma, uncomplicated: Secondary | ICD-10-CM | POA: Insufficient documentation
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
## 2013-06-13 ENCOUNTER — Emergency Department (HOSPITAL_COMMUNITY): Payer: No Typology Code available for payment source

## 2013-06-13 MED ORDER — CYCLOBENZAPRINE HCL 10 MG PO TABS: 10.0000 mg | ORAL_TABLET | Freq: Three times a day (TID) | ORAL | Status: DC | PRN

## 2013-06-13 MED ORDER — CYCLOBENZAPRINE HCL 10 MG PO TABS
10.0000 mg | ORAL_TABLET | Freq: Once | ORAL | Status: AC
  Administered 2013-06-13: 10 mg via ORAL
  Filled 2013-06-13: qty 1

## 2013-06-13 MED ORDER — OXYCODONE-ACETAMINOPHEN 5-325 MG PO TABS: 1.0000 | ORAL_TABLET | Freq: Once | ORAL | Status: DC

## 2013-06-13 MED ORDER — NAPROXEN 500 MG PO TABS: 500.0000 mg | ORAL_TABLET | Freq: Two times a day (BID) | ORAL | Status: DC

## 2013-06-13 MED ORDER — IBUPROFEN 800 MG PO TABS
ORAL_TABLET | ORAL | Status: AC
  Filled 2013-06-13: qty 1

## 2013-06-13 MED ORDER — IBUPROFEN 800 MG PO TABS
800.0000 mg | ORAL_TABLET | Freq: Once | ORAL | Status: AC
  Administered 2013-06-13: 800 mg via ORAL

## 2013-06-13 MED ORDER — OXYCODONE-ACETAMINOPHEN 5-325 MG PO TABS: 1.0000 | ORAL_TABLET | ORAL | Status: DC | PRN

## 2013-06-13 NOTE — ED Provider Notes (Signed)
Medical screening examination/treatment/procedure(s) were performed by non-physician practitioner and as supervising physician I was immediately available for consultation/collaboration.   EKG Interpretation None       Sunnie NielsenBrian Eleora Sutherland, MD 06/13/13 [phone]

## 2013-06-13 NOTE — Discharge Instructions (Signed)
Back Pain, Adult Low back pain is very common. About 1 in 5 people have back pain.The cause of low back pain is rarely dangerous. The pain often gets better over time.About half of people with a sudden onset of back pain feel better in just 2 weeks. About 8 in 10 people feel better by 6 weeks.  CAUSES Some common causes of back pain include:  Strain of the muscles or ligaments supporting the spine.  Wear and tear (degeneration) of the spinal discs.  Arthritis.  Direct injury to the back. DIAGNOSIS Most of the time, the direct cause of low back pain is not known.However, back pain can be treated effectively even when the exact cause of the pain is unknown.Answering your caregiver's questions about your overall health and symptoms is one of the most accurate ways to make sure the cause of your pain is not dangerous. If your caregiver needs more information, he or she may order lab work or imaging tests (X-rays or MRIs).However, even if imaging tests show changes in your back, this usually does not require surgery. HOME CARE INSTRUCTIONS For many people, back pain returns.Since low back pain is rarely dangerous, it is often a condition that people can learn to manageon their own.   Remain active. It is stressful on the back to sit or stand in one place. Do not sit, drive, or stand in one place for more than 30 minutes at a time. Take short walks on level surfaces as soon as pain allows.Try to increase the length of time you walk each day.  Do not stay in bed.Resting more than 1 or 2 days can delay your recovery.  Do not avoid exercise or work.Your body is made to move.It is not dangerous to be active, even though your back may hurt.Your back will likely heal faster if you return to being active before your pain is gone.  Pay attention to your body when you bend and lift. Many people have less discomfortwhen lifting if they bend their knees, keep the load close to their bodies,and  avoid twisting. Often, the most comfortable positions are those that put less stress on your recovering back.  Find a comfortable position to sleep. Use a firm mattress and lie on your side with your knees slightly bent. If you lie on your back, put a pillow under your knees.  Only take over-the-counter or prescription medicines as directed by your caregiver. Over-the-counter medicines to reduce pain and inflammation are often the most helpful.Your caregiver may prescribe muscle relaxant drugs.These medicines help dull your pain so you can more quickly return to your normal activities and healthy exercise.  Put ice on the injured area.  Put ice in a plastic bag.  Place a towel between your skin and the bag.  Leave the ice on for 15-20 minutes, 03-04 times a day for the first 2 to 3 days. After that, ice and heat may be alternated to reduce pain and spasms.  Ask your caregiver about trying back exercises and gentle massage. This may be of some benefit.  Avoid feeling anxious or stressed.Stress increases muscle tension and can worsen back pain.It is important to recognize when you are anxious or stressed and learn ways to manage it.Exercise is a great option. SEEK MEDICAL CARE IF:  You have pain that is not relieved with rest or medicine.  You have pain that does not improve in 1 week.  You have new symptoms.  You are generally not feeling well. SEEK   IMMEDIATE MEDICAL CARE IF:   You have pain that radiates from your back into your legs.  You develop new bowel or bladder control problems.  You have unusual weakness or numbness in your arms or legs.  You develop nausea or vomiting.  You develop abdominal pain.  You feel faint. Document Released: 03/14/2005 Document Revised: 09/13/2011 Document Reviewed: 08/02/2010 [employer] Patient Information 2014 [employer].  
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
## 2013-07-01 ENCOUNTER — Encounter (HOSPITAL_COMMUNITY): Payer: Self-pay | Admitting: Emergency Medicine

## 2013-07-01 ENCOUNTER — Emergency Department (HOSPITAL_COMMUNITY): Payer: Self-pay

## 2013-07-01 ENCOUNTER — Emergency Department (HOSPITAL_COMMUNITY)
Admission: EM | Admit: 2013-07-01 | Discharge: 2013-07-01 | Disposition: A | Discharge status: Home / Self Care | Payer: Self-pay | Attending: Emergency Medicine | Admitting: Emergency Medicine

## 2013-07-01 DIAGNOSIS — S61212A Laceration without foreign body of right middle finger without damage to nail, initial encounter: Secondary | ICD-10-CM

## 2013-07-01 DIAGNOSIS — J45909 Unspecified asthma, uncomplicated: Secondary | ICD-10-CM | POA: Insufficient documentation

## 2013-07-01 DIAGNOSIS — IMO0002 Reserved for concepts with insufficient information to code with codable children: Secondary | ICD-10-CM | POA: Insufficient documentation

## 2013-07-01 DIAGNOSIS — Z791 Long term (current) use of non-steroidal anti-inflammatories (NSAID): Secondary | ICD-10-CM | POA: Insufficient documentation

## 2013-07-01 DIAGNOSIS — Z79899 Other long term (current) drug therapy: Secondary | ICD-10-CM | POA: Insufficient documentation

## 2013-07-01 DIAGNOSIS — Y939 Activity, unspecified: Secondary | ICD-10-CM | POA: Insufficient documentation

## 2013-07-01 DIAGNOSIS — S61209A Unspecified open wound of unspecified finger without damage to nail, initial encounter: Secondary | ICD-10-CM | POA: Insufficient documentation

## 2013-07-01 DIAGNOSIS — Y929 Unspecified place or not applicable: Secondary | ICD-10-CM | POA: Insufficient documentation

## 2013-07-01 MED ORDER — NAPROXEN 500 MG PO TABS: 500.0000 mg | ORAL_TABLET | Freq: Two times a day (BID) | ORAL | Status: DC

## 2013-07-01 MED ORDER — BACITRACIN 500 UNIT/GM EX OINT
1.0000 "application " | TOPICAL_OINTMENT | Freq: Two times a day (BID) | CUTANEOUS | Status: DC
  Administered 2013-07-01: 1 via TOPICAL
  Filled 2013-07-01 (×4): qty 0.9

## 2013-07-01 MED ORDER — NAPROXEN 250 MG PO TABS
500.0000 mg | ORAL_TABLET | Freq: Once | ORAL | Status: AC
  Administered 2013-07-01: 500 mg via ORAL
  Filled 2013-07-01: qty 2

## 2013-07-01 MED ORDER — BACITRACIN ZINC 500 UNIT/GM EX OINT
TOPICAL_OINTMENT | CUTANEOUS | Status: AC
  Filled 2013-07-01: qty 0.9

## 2013-07-01 NOTE — Discharge Instructions (Signed)
Keep the wound clean and dry and use a topical antibiotic for 7 days.  Naprosyn twice daily for pain  Please call your doctor for a followup appointment within 24-48 hours as needed. When you talk to your doctor please let them know that you were seen in the emergency department and have them acquire all of your records so that they can discuss the findings with you and formulate a treatment plan to fully care for your new and ongoing problems.
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
## 2014-02-25 DIAGNOSIS — Z9289 Personal history of other medical treatment: Secondary | ICD-10-CM

## 2014-02-25 HISTORY — DX: Personal history of other medical treatment: Z92.89

## 2014-03-04 ENCOUNTER — Inpatient Hospital Stay (HOSPITAL_COMMUNITY): Payer: Self-pay

## 2014-03-04 ENCOUNTER — Inpatient Hospital Stay (HOSPITAL_COMMUNITY)
Admission: EM | Admit: 2014-03-04 | Discharge: 2014-03-08 | DRG: 025 | Disposition: A | Discharge status: Home / Self Care | Payer: Self-pay | Attending: Internal Medicine | Admitting: Internal Medicine

## 2014-03-04 ENCOUNTER — Encounter (HOSPITAL_COMMUNITY)
Admission: EM | Disposition: A | Discharge status: Home / Self Care | Payer: Self-pay | Source: Home / Self Care | Attending: Pulmonary Disease

## 2014-03-04 ENCOUNTER — Encounter (HOSPITAL_COMMUNITY): Payer: Self-pay | Admitting: Emergency Medicine

## 2014-03-04 ENCOUNTER — Inpatient Hospital Stay (HOSPITAL_COMMUNITY): Payer: Self-pay | Admitting: Certified Registered"

## 2014-03-04 ENCOUNTER — Emergency Department (HOSPITAL_COMMUNITY): Payer: Self-pay

## 2014-03-04 DIAGNOSIS — R519 Headache, unspecified: Secondary | ICD-10-CM

## 2014-03-04 DIAGNOSIS — Y93H1 Activity, digging, shoveling and raking: Secondary | ICD-10-CM

## 2014-03-04 DIAGNOSIS — J45909 Unspecified asthma, uncomplicated: Secondary | ICD-10-CM | POA: Diagnosis present

## 2014-03-04 DIAGNOSIS — G40309 Generalized idiopathic epilepsy and epileptic syndromes, not intractable, without status epilepticus: Principal | ICD-10-CM | POA: Diagnosis present

## 2014-03-04 DIAGNOSIS — R55 Syncope and collapse: Secondary | ICD-10-CM

## 2014-03-04 DIAGNOSIS — R51 Headache: Secondary | ICD-10-CM

## 2014-03-04 DIAGNOSIS — S064X9A Epidural hemorrhage with loss of consciousness of unspecified duration, initial encounter: Secondary | ICD-10-CM | POA: Diagnosis present

## 2014-03-04 DIAGNOSIS — H9191 Unspecified hearing loss, right ear: Secondary | ICD-10-CM | POA: Diagnosis present

## 2014-03-04 DIAGNOSIS — R569 Unspecified convulsions: Secondary | ICD-10-CM

## 2014-03-04 DIAGNOSIS — H5500 Unspecified nystagmus: Secondary | ICD-10-CM | POA: Diagnosis present

## 2014-03-04 DIAGNOSIS — S0219XA Other fracture of base of skull, initial encounter for closed fracture: Secondary | ICD-10-CM | POA: Diagnosis present

## 2014-03-04 DIAGNOSIS — I621 Nontraumatic extradural hemorrhage: Secondary | ICD-10-CM

## 2014-03-04 DIAGNOSIS — Y990 Civilian activity done for income or pay: Secondary | ICD-10-CM

## 2014-03-04 DIAGNOSIS — S0990XA Unspecified injury of head, initial encounter: Secondary | ICD-10-CM

## 2014-03-04 DIAGNOSIS — W1839XA Other fall on same level, initial encounter: Secondary | ICD-10-CM | POA: Diagnosis present

## 2014-03-04 DIAGNOSIS — S064XAA Epidural hemorrhage with loss of consciousness status unknown, initial encounter: Secondary | ICD-10-CM | POA: Diagnosis present

## 2014-03-04 HISTORY — DX: Unspecified injury of head, initial encounter: S09.90XA

## 2014-03-04 HISTORY — PX: CRANIOTOMY: SHX93

## 2014-03-04 HISTORY — DX: Epidural hemorrhage with loss of consciousness of unspecified duration, initial encounter: S06.4X9A

## 2014-03-04 HISTORY — DX: Other fracture of base of skull, initial encounter for closed fracture: S02.19XA

## 2014-03-04 HISTORY — DX: Epidural hemorrhage with loss of consciousness status unknown, initial encounter: S06.4XAA

## 2014-03-04 LAB — URINALYSIS, ROUTINE W REFLEX MICROSCOPIC
Bilirubin Urine: NEGATIVE
GLUCOSE, UA: NEGATIVE mg/dL
Hgb urine dipstick: NEGATIVE
Ketones, ur: >80 mg/dL — AB
LEUKOCYTES UA: NEGATIVE
Nitrite: NEGATIVE
PH: 6 (ref 5.0–8.0)
Protein, ur: NEGATIVE mg/dL
Specific Gravity, Urine: 1.026 (ref 1.005–1.030)
Urobilinogen, UA: 0.2 mg/dL (ref 0.0–1.0)

## 2014-03-04 LAB — BASIC METABOLIC PANEL
Anion gap: 17 — ABNORMAL HIGH (ref 5–15)
BUN: 13 mg/dL (ref 6–23)
CHLORIDE: 101 meq/L (ref 96–112)
CO2: 21 mEq/L (ref 19–32)
Calcium: 9.8 mg/dL (ref 8.4–10.5)
Creatinine, Ser: 0.92 mg/dL (ref 0.50–1.35)
GFR calc Af Amer: >90 mL/min (ref >90)
GLUCOSE: 105 mg/dL — AB (ref 70–99)
POTASSIUM: 4.1 meq/L (ref 3.7–5.3)
Sodium: 139 mEq/L (ref 137–147)

## 2014-03-04 LAB — TYPE AND SCREEN
ABO/RH(D): O POS
Antibody Screen: NEGATIVE

## 2014-03-04 LAB — CBC
HCT: 37.5 % — ABNORMAL LOW (ref 39.0–52.0)
Hemoglobin: 12.7 g/dL — ABNORMAL LOW (ref 13.0–17.0)
MCH: 28.8 pg (ref 26.0–34.0)
MCHC: 33.9 g/dL (ref 30.0–36.0)
MCV: 85 fL (ref 78.0–100.0)
Platelets: 190 10*3/uL (ref 150–400)
RBC: 4.41 MIL/uL (ref 4.22–5.81)
RDW: 12.5 % (ref 11.5–15.5)
WBC: 5.4 10*3/uL (ref 4.0–10.5)

## 2014-03-04 LAB — RAPID URINE DRUG SCREEN, HOSP PERFORMED
Amphetamines: NOT DETECTED
BENZODIAZEPINES: POSITIVE — AB
Barbiturates: NOT DETECTED
COCAINE: NOT DETECTED
OPIATES: NOT DETECTED
Tetrahydrocannabinol: NOT DETECTED

## 2014-03-04 LAB — ETHANOL: Alcohol, Ethyl (B): <11 mg/dL (ref 0–11)

## 2014-03-04 LAB — CBG MONITORING, ED: GLUCOSE-CAPILLARY: 105 mg/dL — AB (ref 70–99)

## 2014-03-04 LAB — ABO/RH: ABO/RH(D): O POS

## 2014-03-04 SURGERY — CRANIOTOMY HEMATOMA EVACUATION EPIDURAL
Anesthesia: General | Site: Head

## 2014-03-04 MED ORDER — HYDROMORPHONE HCL 1 MG/ML IJ SOLN
1.0000 mg | Freq: Once | INTRAMUSCULAR | Status: AC
  Administered 2014-03-04: 1 mg via INTRAVENOUS
  Filled 2014-03-04: qty 1

## 2014-03-04 MED ORDER — LIDOCAINE HCL (CARDIAC) 20 MG/ML IV SOLN
INTRAVENOUS | Status: AC
Start: 2014-03-04 — End: 2014-03-04
  Filled 2014-03-04: qty 5

## 2014-03-04 MED ORDER — ONDANSETRON HCL 4 MG/2ML IJ SOLN
4.0000 mg | Freq: Once | INTRAMUSCULAR | Status: AC | PRN
Start: 2014-03-04 — End: 2014-03-04

## 2014-03-04 MED ORDER — OXYCODONE HCL 5 MG PO TABS: 5.0000 mg | ORAL_TABLET | Freq: Once | ORAL | Status: AC | PRN

## 2014-03-04 MED ORDER — ROCURONIUM BROMIDE 50 MG/5ML IV SOLN
INTRAVENOUS | Status: AC
  Filled 2014-03-04: qty 1

## 2014-03-04 MED ORDER — LEVETIRACETAM IN NACL 500 MG/100ML IV SOLN
500.0000 mg | Freq: Two times a day (BID) | INTRAVENOUS | Status: DC
  Administered 2014-03-05 – 2014-03-07 (×6): 500 mg via INTRAVENOUS
  Filled 2014-03-04 (×9): qty 100

## 2014-03-04 MED ORDER — PROPOFOL 10 MG/ML IV BOLUS
INTRAVENOUS | Status: AC
  Filled 2014-03-04: qty 20

## 2014-03-04 MED ORDER — HYDROMORPHONE HCL 1 MG/ML IJ SOLN
INTRAMUSCULAR | Status: AC
  Filled 2014-03-04: qty 1

## 2014-03-04 MED ORDER — ONDANSETRON HCL 4 MG/2ML IJ SOLN
INTRAMUSCULAR | Status: DC | PRN
  Administered 2014-03-04: 4 mg via INTRAVENOUS

## 2014-03-04 MED ORDER — LIDOCAINE HCL (CARDIAC) 20 MG/ML IV SOLN
INTRAVENOUS | Status: DC | PRN
  Administered 2014-03-04: 100 mg via INTRAVENOUS

## 2014-03-04 MED ORDER — OXYCODONE HCL 5 MG/5ML PO SOLN: 5.0000 mg | Freq: Once | ORAL | Status: AC | PRN

## 2014-03-04 MED ORDER — GLYCOPYRROLATE 0.2 MG/ML IJ SOLN
INTRAMUSCULAR | Status: AC
  Filled 2014-03-04: qty 2

## 2014-03-04 MED ORDER — GLYCOPYRROLATE 0.2 MG/ML IJ SOLN
INTRAMUSCULAR | Status: DC | PRN
  Administered 2014-03-04: .5 mg via INTRAVENOUS

## 2014-03-04 MED ORDER — CEFAZOLIN SODIUM-DEXTROSE 2-3 GM-% IV SOLR
INTRAVENOUS | Status: AC
  Filled 2014-03-04: qty 50

## 2014-03-04 MED ORDER — SODIUM CHLORIDE 0.9 % IV SOLN: 250.0000 mL | INTRAVENOUS | Status: DC | PRN

## 2014-03-04 MED ORDER — SUCCINYLCHOLINE CHLORIDE 20 MG/ML IJ SOLN
INTRAMUSCULAR | Status: DC | PRN
  Administered 2014-03-04: 120 mg via INTRAVENOUS

## 2014-03-04 MED ORDER — PROPOFOL 10 MG/ML IV BOLUS
INTRAVENOUS | Status: DC | PRN
  Administered 2014-03-04: 200 mg via INTRAVENOUS

## 2014-03-04 MED ORDER — ROCURONIUM BROMIDE 100 MG/10ML IV SOLN
INTRAVENOUS | Status: DC | PRN
  Administered 2014-03-04: 50 mg via INTRAVENOUS

## 2014-03-04 MED ORDER — SURGIFOAM 100 EX MISC
CUTANEOUS | Status: DC | PRN
  Administered 2014-03-04: 20 mL via TOPICAL

## 2014-03-04 MED ORDER — SODIUM CHLORIDE 0.9 % IV SOLN
INTRAVENOUS | Status: DC
  Administered 2014-03-04 – 2014-03-05 (×2): via INTRAVENOUS

## 2014-03-04 MED ORDER — ONDANSETRON HCL 4 MG/2ML IJ SOLN
INTRAMUSCULAR | Status: AC
  Filled 2014-03-04: qty 2

## 2014-03-04 MED ORDER — LIDOCAINE-EPINEPHRINE 1 %-1:100000 IJ SOLN
INTRAMUSCULAR | Status: DC | PRN
  Administered 2014-03-04: 10 mL via INTRADERMAL

## 2014-03-04 MED ORDER — MIDAZOLAM HCL 2 MG/2ML IJ SOLN
INTRAMUSCULAR | Status: AC
  Filled 2014-03-04: qty 2

## 2014-03-04 MED ORDER — FENTANYL CITRATE 0.05 MG/ML IJ SOLN
25.0000 ug | INTRAMUSCULAR | Status: DC | PRN
  Administered 2014-03-04 (×3): 25 ug via INTRAVENOUS
  Administered 2014-03-04: 50 ug via INTRAVENOUS
  Administered 2014-03-05 (×4): 100 ug via INTRAVENOUS
  Administered 2014-03-05: 50 ug via INTRAVENOUS
  Administered 2014-03-05 – 2014-03-07 (×21): 100 ug via INTRAVENOUS
  Filled 2014-03-04 (×30): qty 2

## 2014-03-04 MED ORDER — ALBUMIN HUMAN 5 % IV SOLN
INTRAVENOUS | Status: DC | PRN
  Administered 2014-03-04: 23:00:00 via INTRAVENOUS

## 2014-03-04 MED ORDER — LACTATED RINGERS IV SOLN
INTRAVENOUS | Status: DC | PRN
  Administered 2014-03-04 (×2): via INTRAVENOUS

## 2014-03-04 MED ORDER — FENTANYL CITRATE 0.05 MG/ML IJ SOLN
INTRAMUSCULAR | Status: AC
  Filled 2014-03-04: qty 5

## 2014-03-04 MED ORDER — ONDANSETRON HCL 4 MG/2ML IJ SOLN
4.0000 mg | Freq: Once | INTRAMUSCULAR | Status: AC
  Administered 2014-03-04: 4 mg via INTRAVENOUS
  Filled 2014-03-04: qty 2

## 2014-03-04 MED ORDER — FENTANYL CITRATE 0.05 MG/ML IJ SOLN
INTRAMUSCULAR | Status: DC | PRN
  Administered 2014-03-04: 150 ug via INTRAVENOUS
  Administered 2014-03-04 (×2): 100 ug via INTRAVENOUS
  Administered 2014-03-04: 150 ug via INTRAVENOUS

## 2014-03-04 MED ORDER — SODIUM CHLORIDE 0.9 % IR SOLN
Status: DC | PRN
  Administered 2014-03-04: 500 mL

## 2014-03-04 MED ORDER — CEFAZOLIN SODIUM-DEXTROSE 2-3 GM-% IV SOLR
INTRAVENOUS | Status: DC | PRN
  Administered 2014-03-04: 2 g via INTRAVENOUS

## 2014-03-04 MED ORDER — MEPERIDINE HCL 25 MG/ML IJ SOLN: 6.2500 mg | INTRAMUSCULAR | Status: DC | PRN

## 2014-03-04 MED ORDER — 0.9 % SODIUM CHLORIDE (POUR BTL) OPTIME
TOPICAL | Status: DC | PRN
  Administered 2014-03-04: 1000 mL

## 2014-03-04 MED ORDER — SUCCINYLCHOLINE CHLORIDE 20 MG/ML IJ SOLN
INTRAMUSCULAR | Status: AC
  Filled 2014-03-04: qty 1

## 2014-03-04 MED ORDER — LEVETIRACETAM IN NACL 1000 MG/100ML IV SOLN
1000.0000 mg | Freq: Once | INTRAVENOUS | Status: AC
  Administered 2014-03-04: 1000 mg via INTRAVENOUS
  Filled 2014-03-04: qty 100

## 2014-03-04 MED ORDER — ONDANSETRON HCL 4 MG/2ML IJ SOLN
4.0000 mg | Freq: Four times a day (QID) | INTRAMUSCULAR | Status: DC | PRN
  Administered 2014-03-05: 4 mg via INTRAVENOUS
  Filled 2014-03-04: qty 2

## 2014-03-04 MED ORDER — NEOSTIGMINE METHYLSULFATE 10 MG/10ML IV SOLN
INTRAVENOUS | Status: AC
  Filled 2014-03-04: qty 1

## 2014-03-04 MED ORDER — HYDROMORPHONE HCL 1 MG/ML IJ SOLN
0.2500 mg | INTRAMUSCULAR | Status: DC | PRN
  Administered 2014-03-04 – 2014-03-05 (×4): 0.5 mg via INTRAVENOUS

## 2014-03-04 MED ORDER — NEOSTIGMINE METHYLSULFATE 10 MG/10ML IV SOLN
INTRAVENOUS | Status: DC | PRN
  Administered 2014-03-04: 4 mg via INTRAVENOUS

## 2014-03-04 SURGICAL SUPPLY — 76 items
BAG DECANTER FOR FLEXI CONT (MISCELLANEOUS) ×3 IMPLANT
BANDAGE GAUZE 4  KLING STR (GAUZE/BANDAGES/DRESSINGS) IMPLANT
BIT DRILL WIRE PASS 1.3MM (BIT) ×1 IMPLANT
BLADE SURG 11 STRL SS (BLADE) IMPLANT
BNDG COHESIVE 4X5 TAN NS LF (GAUZE/BANDAGES/DRESSINGS) IMPLANT
BRUSH SCRUB EZ 1% IODOPHOR (MISCELLANEOUS) IMPLANT
BRUSH SCRUB EZ PLAIN DRY (MISCELLANEOUS) ×3 IMPLANT
BUR ACORN 6.0 PRECISION (BURR) ×2 IMPLANT
BUR ACORN 6.0MM PRECISION (BURR) ×1
BUR ROUTER D-58 CRANI (BURR) ×3 IMPLANT
CANISTER SUCT 3000ML (MISCELLANEOUS) ×3 IMPLANT
CLIP TI MEDIUM 6 (CLIP) IMPLANT
CONT SPEC 4OZ CLIKSEAL STRL BL (MISCELLANEOUS) ×3 IMPLANT
CORDS BIPOLAR (ELECTRODE) ×3 IMPLANT
COVER BACK TABLE 60X90IN (DRAPES) ×6 IMPLANT
DRAIN CHANNEL 10M FLAT 3/4 FLT (DRAIN) IMPLANT
DRAPE MICROSCOPE LEICA (MISCELLANEOUS) IMPLANT
DRAPE NEUROLOGICAL W/INCISE (DRAPES) ×3 IMPLANT
DRAPE SURG 17X23 STRL (DRAPES) IMPLANT
DRAPE WARM FLUID 44X44 (DRAPE) ×3 IMPLANT
DRILL WIRE PASS 1.3MM (BIT) ×3
DRSG TELFA 3X8 NADH (GAUZE/BANDAGES/DRESSINGS) IMPLANT
ELECT CAUTERY BLADE 6.4 (BLADE) ×3 IMPLANT
ELECT REM PT RETURN 9FT ADLT (ELECTROSURGICAL) ×3
ELECTRODE REM PT RTRN 9FT ADLT (ELECTROSURGICAL) ×1 IMPLANT
EVACUATOR SILICONE 100CC (DRAIN) IMPLANT
GAUZE SPONGE 4X4 12PLY STRL (GAUZE/BANDAGES/DRESSINGS) ×3 IMPLANT
GAUZE SPONGE 4X4 16PLY XRAY LF (GAUZE/BANDAGES/DRESSINGS) IMPLANT
GLOVE BIO SURGEON STRL SZ 6.5 (GLOVE) ×4 IMPLANT
GLOVE BIO SURGEON STRL SZ7 (GLOVE) ×3 IMPLANT
GLOVE BIO SURGEONS STRL SZ 6.5 (GLOVE) ×2
GLOVE BIOGEL PI IND STRL 6.5 (GLOVE) ×1 IMPLANT
GLOVE BIOGEL PI INDICATOR 6.5 (GLOVE) ×2
GLOVE ECLIPSE 9.0 STRL (GLOVE) ×3 IMPLANT
GLOVE EXAM NITRILE LRG STRL (GLOVE) IMPLANT
GLOVE EXAM NITRILE MD LF STRL (GLOVE) IMPLANT
GLOVE EXAM NITRILE XL STR (GLOVE) IMPLANT
GLOVE EXAM NITRILE XS STR PU (GLOVE) IMPLANT
GOWN STRL REUS W/ TWL LRG LVL3 (GOWN DISPOSABLE) ×1 IMPLANT
GOWN STRL REUS W/ TWL XL LVL3 (GOWN DISPOSABLE) ×1 IMPLANT
GOWN STRL REUS W/TWL 2XL LVL3 (GOWN DISPOSABLE) IMPLANT
GOWN STRL REUS W/TWL LRG LVL3 (GOWN DISPOSABLE) ×2
GOWN STRL REUS W/TWL XL LVL3 (GOWN DISPOSABLE) ×2
HEMOSTAT SURGICEL 2X14 (HEMOSTASIS) IMPLANT
HOOK DURA (MISCELLANEOUS) ×3 IMPLANT
KIT BASIN OR (CUSTOM PROCEDURE TRAY) ×3 IMPLANT
KIT ROOM TURNOVER OR (KITS) ×3 IMPLANT
LIQUID BAND (GAUZE/BANDAGES/DRESSINGS) IMPLANT
NEEDLE HYPO 25X1 1.5 SAFETY (NEEDLE) ×3 IMPLANT
NS IRRIG 1000ML POUR BTL (IV SOLUTION) ×3 IMPLANT
PACK CRANIOTOMY (CUSTOM PROCEDURE TRAY) ×3 IMPLANT
PAD ARMBOARD 7.5X6 YLW CONV (MISCELLANEOUS) ×3 IMPLANT
PAD EYE OVAL STERILE LF (GAUZE/BANDAGES/DRESSINGS) IMPLANT
PATTIES SURGICAL .25X.25 (GAUZE/BANDAGES/DRESSINGS) IMPLANT
PATTIES SURGICAL .5 X.5 (GAUZE/BANDAGES/DRESSINGS) IMPLANT
PATTIES SURGICAL .5 X3 (DISPOSABLE) IMPLANT
PATTIES SURGICAL 1X1 (DISPOSABLE) IMPLANT
PIN MAYFIELD SKULL DISP (PIN) IMPLANT
PLATE 1.5  2HOLE LNG NEURO (Plate) ×10 IMPLANT
PLATE 1.5 2HOLE LNG NEURO (Plate) ×5 IMPLANT
SCREW SELF DRILL HT 1.5/4MM (Screw) ×30 IMPLANT
SPECIMEN JAR SMALL (MISCELLANEOUS) IMPLANT
SPONGE LAP 18X18 X RAY DECT (DISPOSABLE) IMPLANT
SPONGE NEURO XRAY DETECT 1X3 (DISPOSABLE) IMPLANT
SPONGE SURGIFOAM ABS GEL 100 (HEMOSTASIS) ×3 IMPLANT
STAPLER VISISTAT 35W (STAPLE) ×3 IMPLANT
SUT NURALON 4 0 TR CR/8 (SUTURE) ×6 IMPLANT
SUT VIC AB 2-0 CT2 18 VCP726D (SUTURE) ×6 IMPLANT
SYR 20ML ECCENTRIC (SYRINGE) ×3 IMPLANT
SYR CONTROL 10ML LL (SYRINGE) ×3 IMPLANT
TAPE CLOTH SURG 4X10 WHT LF (GAUZE/BANDAGES/DRESSINGS) ×3 IMPLANT
TOWEL OR 17X24 6PK STRL BLUE (TOWEL DISPOSABLE) ×3 IMPLANT
TOWEL OR 17X26 10 PK STRL BLUE (TOWEL DISPOSABLE) ×3 IMPLANT
TRAY FOLEY CATH 14FRSI W/METER (CATHETERS) IMPLANT
UNDERPAD 30X30 INCONTINENT (UNDERPADS AND DIAPERS) IMPLANT
WATER STERILE IRR 1000ML POUR (IV SOLUTION) ×3 IMPLANT

## 2014-03-04 NOTE — Consult Note (Signed)
Reason for Consult: Epidural hematoma Referring Physician: Emergency department  STEFAN MARKARIAN is an 28 y.o. male.  HPI: 28 year old male admitted with probable syncopal spell and/or new onset seizure. Patient working today when he felt over heated. 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 Probable witness generalized seizure activity noted at the scene. Patient has been hemodynamically stable. Currently complains of right-sided headache. Denies any numbness, tingling, weakness. no further seizure activity noted.  Past Medical History  Diagnosis Date  . Asthma   . Temporal bone fracture 03/04/14    Right  . Epidural hematoma 03/04/14    Past Surgical History  Procedure Laterality Date  . Hernia repair    . Hernia      Family History  Problem Relation Age of Onset  . Thyroid disease Mother     Social History:  reports that he has never smoked. He does not have any smokeless tobacco history on file. He reports that he does not drink alcohol or use illicit drugs.  Allergies:  Allergies  Allergen Reactions  . Hydrocodone Hives and Itching    Medications: I have reviewed the patient's current medications.  Results for orders placed or performed during the hospital encounter of 03/04/14 (from the past 48 hour(s))  CBC (if new onset seizures)     Status: Abnormal   Collection Time: 03/04/14  1:06 PM  Result Value Ref Range   WBC 5.4 4.0 - 10.5 K/uL   RBC 4.41 4.22 - 5.81 MIL/uL   Hemoglobin 12.7 (L) 13.0 - 17.0 g/dL   HCT 37.5 (L) 39.0 - 52.0 %   MCV 85.0 78.0 - 100.0 fL   MCH 28.8 26.0 - 34.0 pg   MCHC 33.9 30.0 - 36.0 g/dL   RDW 12.5 11.5 - 15.5 %   Platelets 190 150 - 400 K/uL  Basic metabolic panel (if new onset seizures)     Status: Abnormal   Collection Time: 03/04/14  1:06 PM  Result Value Ref Range   Sodium 139 137 - 147 mEq/L   Potassium 4.1 3.7 - 5.3 mEq/L   Chloride 101 96 - 112 mEq/L   CO2  21 19 - 32 mEq/L   Glucose, Bld 105 (H) 70 - 99 mg/dL   BUN 13 6 - 23 mg/dL   Creatinine, Ser 0.92 0.50 - 1.35 mg/dL   Calcium 9.8 8.4 - 10.5 mg/dL   GFR calc non Af Amer >90 >90 mL/min   GFR calc Af Amer >90 >90 mL/min    Comment: (NOTE) The eGFR has been calculated using the CKD EPI equation. This calculation has not been validated in all clinical situations. eGFR's persistently <90 mL/min signify possible Chronic Kidney Disease.    Anion gap 17 (H) 5 - 15  Ethanol     Status: None   Collection Time: 03/04/14  1:24 PM  Result Value Ref Range   Alcohol, Ethyl (B) <11 0 - 11 mg/dL    Comment:        LOWEST DETECTABLE LIMIT FOR SERUM ALCOHOL IS 11 mg/dL FOR MEDICAL PURPOSES ONLY   CBG monitoring, ED     Status: Abnormal   Collection Time: 03/04/14  2:46 PM  Result Value Ref Range   Glucose-Capillary 105 (H) 70 - 99 mg/dL    Ct Head Wo Contrast   (if New Onset Seizure And/or Head Trauma)  03/04/2014   CLINICAL DATA:  Fall with head trauma and seizure.  EXAM: CT HEAD WITHOUT CONTRAST  CT CERVICAL SPINE WITHOUT CONTRAST  TECHNIQUE: Multidetector CT imaging of the head and cervical spine was performed following the standard protocol without intravenous contrast. Multiplanar CT image reconstructions of the cervical spine were also generated.  COMPARISON:  None.  FINDINGS: CT HEAD FINDINGS  There is a subtle fracture through the right temporal bone with an underlying small right temporal epidural hematoma measuring 9 mm in thickness and approximately 4.5 cm in length. There is a small amount of subdural hemorrhage extending superiorly over the right posterior temporal lobe extending over the right posterior temporal lobe. There is no midline shift.  The fracture extends into the right temporal fossa through the anterior aspect of the mastoid process. There is blood in the mastoid air cells and right middle ear cavity.  There is a scalp contusion over the right temporal bone and right posterior  parietal bone.  There is an air-fluid level and mucosal thickening in the right maxillary sinus consistent with sinusitis. Retention cyst in the sphenoid sinus.  CT CERVICAL SPINE FINDINGS  There is no fracture, subluxation, prevertebral soft tissue swelling, degenerative disc disease or facet arthritis, or other abnormality of the cervical spine.  IMPRESSION: 1. Acute right temporal epidural hematoma with a nondisplaced fracture through the right temporal bone . There is an adjacent small subdural hematoma. No local mass effect or midline shift. Blood in the right mastoid air cells and right middle ear cavity secondary to the fracture extending through the mastoid process. 2. Normal CT scan of the cervical spine. Critical Value/emergent results were called by telephone at the time of interpretation on 03/04/2014 at 2:24 pm to Dr. Veryl Speak , who verbally acknowledged these results.   Electronically Signed   By: Rozetta Nunnery M.D.   On: 03/04/2014 14:39   Ct Cervical Spine Wo Contrast  03/04/2014   CLINICAL DATA:  Fall with head trauma and seizure.  EXAM: CT HEAD WITHOUT CONTRAST  CT CERVICAL SPINE WITHOUT CONTRAST  TECHNIQUE: Multidetector CT imaging of the head and cervical spine was performed following the standard protocol without intravenous contrast. Multiplanar CT image reconstructions of the cervical spine were also generated.  COMPARISON:  None.  FINDINGS: CT HEAD FINDINGS  There is a subtle fracture through the right temporal bone with an underlying small right temporal epidural hematoma measuring 9 mm in thickness and approximately 4.5 cm in length. There is a small amount of subdural hemorrhage extending superiorly over the right posterior temporal lobe extending over the right posterior temporal lobe. There is no midline shift.  The fracture extends into the right temporal fossa through the anterior aspect of the mastoid process. There is blood in the mastoid air cells and right middle ear cavity.   There is a scalp contusion over the right temporal bone and right posterior parietal bone.  There is an air-fluid level and mucosal thickening in the right maxillary sinus consistent with sinusitis. Retention cyst in the sphenoid sinus.  CT CERVICAL SPINE FINDINGS  There is no fracture, subluxation, prevertebral soft tissue swelling, degenerative disc disease or facet arthritis, or other abnormality of the cervical spine.  IMPRESSION: 1. Acute right temporal epidural hematoma with a nondisplaced fracture through the right temporal bone . There is an adjacent small subdural hematoma. No local mass effect or midline shift. Blood in the right mastoid air cells and right middle ear cavity secondary to the fracture extending through the mastoid process. 2. Normal CT scan of the cervical spine.  Critical Value/emergent results were called by telephone at the time of interpretation on 03/04/2014 at 2:24 pm to Dr. Veryl Speak , who verbally acknowledged these results.   Electronically Signed   By: Rozetta Nunnery M.D.   On: 03/04/2014 14:39    Pertinent items are noted in HPI. Blood pressure 120/87, pulse 68, temperature 98.6 F (37 C), temperature source Oral, resp. rate 21, height _0  (1.778 m), weight 65.772 kg (145 lb), SpO2 100 %. The patient is slightly somnolent but will awaken easily. His speech is clear and fluent. He is oriented 4 over 4. Examination of his head ears eyes and throat demonstrates some swelling in his right temporal and parietal regions. No laceration. No obvious bony abnormality. Oropharynx, nasopharynx and external auditory canals clear. Neck exam findings no evidence of significant cervical tenderness. Airway midline. Carotid pulses normal. Cranial nerve examination finds pupils be equal at 3 mm bilaterally. Extraocular movements are full. Visual acuity and visual fields grossly normal. Facial movement and sensation normal bilaterally. Tongue protrudes midline. Palate elevates to midline.  Motor examination of the extremities demonstrate no evidence of motor weakness. No evidence of pronator drift. Sensory examination nonfocal. Reflexes normal.  Assessment/Plan: Status post significant fall with resultant traumatic brain injury. Patient with non-depressed right temporal fracture with some underlying epidural blood. Collection of blood most likely secondary to fracture line itself as the fracture is not over the likely course of the middle meningeal artery. I would recommend ICU observation and follow-up head CT scan in 8 hours. If there is evidence of growth of the hematoma then surgical evacuation should be performed. Currently the hematoma is quite small and does not exhibit mass effect. Workup for seizure and/or syncope ongoing.  Jaquan Sadowsky A 03/04/2014, 5:07 PM

## 2014-03-04 NOTE — Anesthesia Postprocedure Evaluation (Signed)
Anesthesia Post Note  Patient: Marcus CrumbleRobert W Mora  Procedure(s) Performed: Procedure(s) (LRB): CRANIOTOMY HEMATOMA EVACUATION EPIDURAL (N/A)  Anesthesia type: general  Patient location: PACU  Post pain: Pain level controlled  Post assessment: Patient's Cardiovascular Status Stable  Last Vitals:  Filed Vitals:   03/04/14 2336  BP: 130/76  Pulse: 76  Temp:   Resp: 16    Post vital signs: Reviewed and stable  Level of consciousness: sedated  Complications: No apparent anesthesia complications

## 2014-03-04 NOTE — ED Provider Notes (Addendum)
CSN: [id number]     Arrival date & time 03/04/14  1239 History   First MD Initiated Contact with Patient 03/04/14 1317     Chief Complaint  Patient presents with  . Seizures     (Consider location/radiation/quality/duration/timing/severity/associated sxs/prior Treatment) HPI Comments: Patient is a 28 year old male with past medical history of asthma. He presents today with complaints of an apparent seizure. 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 He said he woke up moments later surrounded by EMS personnel unsure as to what happened. He is complaining of pain in his head and arm. He denies any bowel or bladder incontinence.  Patient is a 28 y.o. male presenting with seizures. The history is provided by the patient.  Seizures Seizure activity on arrival: no   Seizure type:  Grand mal Preceding symptoms: aura   Initial focality:  None Postictal symptoms: confusion   Severity:  Moderate Timing:  Once Progression:  Worsening   Past Medical History  Diagnosis Date  . Asthma    Past Surgical History  Procedure Laterality Date  . Hernia repair    . Hernia     Family History  Problem Relation Age of Onset  . Thyroid disease Mother    History  Substance Use Topics  . Smoking status: Never Smoker   . Smokeless tobacco: Not on file  . Alcohol Use: No     Comment: denies use 03/12/12    Review of Systems  Neurological: Positive for seizures.  All other systems reviewed and are negative.     Allergies  Hydrocodone  Home Medications   Prior to Admission medications   Medication Sig Start Date End Date Taking? Authorizing Provider  acetaminophen (TYLENOL) 500 MG tablet Take 1,000 mg by mouth every 6 (six) hours as needed. Pain.   Yes Historical Provider, MD  cyclobenzaprine (FLEXERIL) 10 MG tablet Take 1 tablet (10 mg total) by mouth 3 (three) times daily as needed. Patient not taking: Reported on  03/04/2014 06/13/13   Tammy L. Triplett, PA-C  ibuprofen (ADVIL,MOTRIN) 600 MG tablet Take 1 tablet (600 mg total) by mouth every 8 (eight) hours as needed. Patient not taking: Reported on 03/04/2014 03/06/13   Lyanne CoKevin M Campos, MD  ibuprofen (ADVIL,MOTRIN) 800 MG tablet Take 1 tablet (800 mg total) by mouth 3 (three) times daily. Patient not taking: Reported on 03/04/2014 10/31/12   Tammy L. Triplett, PA-C  naproxen (NAPROSYN) 500 MG tablet Take 1 tablet (500 mg total) by mouth 2 (two) times daily. Patient not taking: Reported on 03/04/2014 06/13/13   Tammy L. Triplett, PA-C  naproxen (NAPROSYN) 500 MG tablet Take 1 tablet (500 mg total) by mouth 2 (two) times daily with a meal. Patient not taking: Reported on 03/04/2014 07/01/13   Vida RollerBrian D Miller, MD  oxyCODONE-acetaminophen (PERCOCET/ROXICET) 5-325 MG per tablet Take 1 tablet by mouth every 4 (four) hours as needed for pain. Patient not taking: Reported on 03/04/2014 10/31/12   Tammy L. Triplett, PA-C  oxyCODONE-acetaminophen (PERCOCET/ROXICET) 5-325 MG per tablet Take 1 tablet by mouth every 4 (four) hours as needed for severe pain. Patient not taking: Reported on 03/04/2014 05/29/13   Tammy L. Triplett, PA-C  oxyCODONE-acetaminophen (PERCOCET/ROXICET) 5-325 MG per tablet Take 1 tablet by mouth every 4 (four) hours as needed for severe pain. Patient not taking: Reported on 03/04/2014 06/13/13   Tammy L. Triplett, PA-C  penicillin v potassium (VEETID) 500 MG tablet Take  1 tablet (500 mg total) by mouth 4 (four) times daily. Patient not taking: Reported on 03/04/2014 03/06/13   Lyanne CoKevin M Campos, MD   BP 125/85 mmHg  Pulse 98  Temp(Src) 97.7 F (36.5 C)  Resp 15  Ht 5\' 10"  (1.778 m)  Wt 145 lb (65.772 kg)  BMI 20.81 kg/m2  SpO2 99% Physical Exam  Constitutional: He is oriented to person, place, and time. He appears well-developed and well-nourished. No distress.  HENT:  Head: Normocephalic and atraumatic.  There is blood in the right ear canal  Eyes: EOM are  normal. Pupils are equal, round, and reactive to light.  Neck: Normal range of motion. Neck supple.  There is tenderness to palpation in the soft tissues of the cervical region. There is no bony tenderness and no step-off.  Cardiovascular: Normal rate, regular rhythm and normal heart sounds.   No murmur heard. Pulmonary/Chest: Effort normal and breath sounds normal. No respiratory distress. He has no wheezes.  Abdominal: Soft. Bowel sounds are normal. He exhibits no distension. There is no tenderness.  Musculoskeletal: Normal range of motion. He exhibits no edema.  Lymphadenopathy:    He has no cervical adenopathy.  Neurological: He is alert and oriented to person, place, and time. No cranial nerve deficit. He exhibits normal muscle tone. Coordination normal.  Skin: Skin is warm and dry. He is not diaphoretic.  Nursing note and vitals reviewed.   ED Course  Procedures (including critical care time) Labs Review Labs Reviewed  CBC - Abnormal; Notable for the following:    Hemoglobin 12.7 (*)    HCT 37.5 (*)    All other components within normal limits  BASIC METABOLIC PANEL - Abnormal; Notable for the following:    Glucose, Bld 105 (*)    Anion gap 17 (*)    All other components within normal limits  ETHANOL  URINALYSIS, ROUTINE W REFLEX MICROSCOPIC  URINE RAPID DRUG SCREEN (HOSP PERFORMED)  CBG MONITORING, ED    Imaging Review No results found.   EKG Interpretation   Date/Time:  Tuesday March 04 2014 12:51:40 EST Ventricular Rate:  100 PR Interval:  133 QRS Duration: 95 QT Interval:  366 QTC Calculation: 472 R Axis:   95 Text Interpretation:  Sinus tachycardia Lateral infarct, acute Baseline  wander in lead(s) I II aVR Confirmed by DELOS  MD, Kyeisha Janowicz ([id number]) on  03/04/2014 1:29:14 PM      MDM   Final diagnoses:  None    Patient presents here after what appears to be a new onset seizure causing him to fall and strike his head. He is complaining of headache  and is noted to have blood in his right ear. Workup was performed including laboratory studies and imaging of the head and neck. The head CT reveals a small epidural hematoma on the right with fracture through the temporal bone. The cervical spine imaging is unremarkable. The patient is neurologically intact and his only complaints are headache.  I have discussed the above findings with Dr. Dutch QuintPoole from neurosurgery who is planning to observe the patient very closely overnight. He has requested that I admit to critical care. I've spoken with Dr. Sung AmabileSimonds who agrees to admit.  CRITICAL CARE Performed by: Geoffery LyonseLo, Danaya Geddis Total critical care time: 30 minutes Critical care time was exclusive of separately billable procedures and treating other patients. Critical care was necessary to treat or prevent imminent or life-threatening deterioration. Critical care was time spent personally by me on the following activities: development  of treatment plan with patient and/or surrogate as well as nursing, discussions with consultants, evaluation of patient's response to treatment, examination of patient, obtaining history from patient or surrogate, ordering and performing treatments and interventions, ordering and review of laboratory studies, ordering and review of radiographic studies, pulse oximetry and re-evaluation of patient's condition.     Geoffery Lyons, MD 03/04/14 1610  Geoffery Lyons, MD 03/04/14 [phone]

## 2014-03-04 NOTE — ED Notes (Signed)
Per ems, pt with witnessed fall followed by "full body shaking" lasting approx 3-4 minutes per bystanders.  Pt arrives awake, oriented, somnolent, blood noted in right ear.

## 2014-03-04 NOTE — Brief Op Note (Signed)
03/04/2014  11:11 PM  PATIENT:  Marcus Mora  28 y.o. male  PRE-OPERATIVE DIAGNOSIS:  Epidural Hematoma  POST-OPERATIVE DIAGNOSIS:  Epidural Hematoma  PROCEDURE:  Procedure(s): CRANIOTOMY HEMATOMA EVACUATION EPIDURAL (N/A)  SURGEON:  Surgeon(s) and Role:    * Temple PaciniHenry A Jsaon Yoo, MD - Primary  PHYSICIAN ASSISTANT:   ASSISTANTS:    ANESTHESIA:   general  EBL:  Total I/O In: 1250 [I.V.:1000; IV Piggyback:250] Out: 300 [Urine:175; Blood:125]  BLOOD ADMINISTERED:none  DRAINS: none   LOCAL MEDICATIONS USED:  LIDOCAINE   SPECIMEN:  No Specimen  DISPOSITION OF SPECIMEN:  N/A  COUNTS:  YES  TOURNIQUET:  * No tourniquets in log *  DICTATION: .Dragon Dictation  PLAN OF CARE: Admit to inpatient   PATIENT DISPOSITION:  PACU - hemodynamically stable.   Delay start of Pharmacological VTE agent (>24hrs) due to surgical blood loss or risk of bleeding: yes

## 2014-03-04 NOTE — Transfer of Care (Signed)
Immediate Anesthesia Transfer of Care Note  Patient: Marcus CrumbleRobert W Stay  Procedure(s) Performed: Procedure(s): CRANIOTOMY HEMATOMA EVACUATION EPIDURAL (N/A)  Patient Location: PACU  Anesthesia Type:General  Level of Consciousness: awake, alert  and oriented  Airway & Oxygen Therapy: Patient Spontanous Breathing and Patient connected to nasal cannula oxygen  Post-op Assessment: Report given to PACU RN and Post -op Vital signs reviewed and stable  Post vital signs: Reviewed and stable  Complications: No apparent anesthesia complications

## 2014-03-04 NOTE — Progress Notes (Signed)
Unit CM UR Completed by [facility] ED CM  W. Koah Chisenhall RN  

## 2014-03-04 NOTE — Procedures (Signed)
EEG report.  Brief clinical history: 28 year old male admitted with probable syncopal spell and/or new onset seizure    Technique: this is a 17 channel routine scalp EEG performed at the bedside with bipolar and monopolar montages arranged in accordance to the international 10/20 system of electrode placement. One channel was dedicated to EKG recording.  The study was performed during wakefulness, drowsiness, and stage 2 sleep. No activating procedures performed.  Description:In the wakeful state, the best background consisted of a medium amplitude, posterior dominant, well sustained, symmetric and reactive 10 Hz rhythm. Drowsiness demonstrated dropout of the alpha rhythm. Stage 2 sleep showed symmetric and synchronous sleep spindles without intermixed epileptiform discharges.  No focal or generalized epileptiform discharges noted.  No pathologic areas of slowing seen.  EKG showed sinus rhythm.  Impression: this is a normal awake and asleep EEG. Please, be aware that a normal EEG does not exclude the possibility of epilepsy.  Clinical correlation is advised.   Wyatt Portelasvaldo Casmer Yepiz, MD

## 2014-03-04 NOTE — Anesthesia Procedure Notes (Signed)
Procedure Name: Intubation Date/Time: 03/04/2014 9:51 PM Performed by: Nicholos JohnsMCPHAIL, Rishawn Walck S Pre-anesthesia Checklist: Patient identified, Timeout performed, Emergency Drugs available, Suction available and Patient being monitored Patient Re-evaluated:Patient Re-evaluated prior to inductionOxygen Delivery Method: Circle system utilized Preoxygenation: Pre-oxygenation with 100% oxygen Intubation Type: IV induction, Rapid sequence and Cricoid Pressure applied Ventilation: Mask ventilation without difficulty Laryngoscope Size: Mac and 4 Grade View: Grade I Tube type: Subglottic suction tube Tube size: 7.5 mm Number of attempts: 1 Airway Equipment and Method: Stylet Placement Confirmation: ETT inserted through vocal cords under direct vision,  breath sounds checked- equal and bilateral and positive ETCO2 Secured at: 22 cm Tube secured with: Tape Dental Injury: Teeth and Oropharynx as per pre-operative assessment

## 2014-03-04 NOTE — ED Notes (Signed)
EEG tech at bedside. 

## 2014-03-04 NOTE — Progress Notes (Signed)
Follow-up head CT scan this evening demonstrates progressive enlargement of his right temporal parietal epidural hematoma with increasing mass effect.  I discussed situation with the patient and his father. I have recommended we proceed to the operating room for emergent right-sided craniotomy and evacuation of epidural hematoma. I briefly discussed the risks and benefits. The patient and his father agreed to proceed.

## 2014-03-04 NOTE — ED Notes (Signed)
eeg team at bedside

## 2014-03-04 NOTE — H&P (Signed)
PULMONARY / CRITICAL CARE MEDICINE   Name: Marcus Mora MRN: [id number] DOB: 11/09/1985    ADMISSION DATE:  03/04/2014  REFERRING MD :  Dr. Judd Lienelo  CHIEF COMPLAINT:  EDH   INITIAL PRESENTATION: 28 y/o M with a PMH of asthma admitted after an apparent seizure (no hx).  CT head positive for temporal bone fx & epidural hematoma.   PCCM consulted for ICU admission.   STUDIES:  12/08  CT Head >> subtle fx of R temporal bone with underlying temporal epidural hematoma (9mm x 4.5 cm), no midline shift, scalp contusion over R temporal bone & R posterior parietal bone, blood in mastoid air cells and R middle ear 12/08  CT Cervical Spine >> nml   SIGNIFICANT EVENTS: 12/08  Admit after ? Seizure, collapse at work.  CT head positive for epidural hematoma and temporal fracture   HISTORY OF PRESENT ILLNESS:   28 y/o M with a PMH of childhood asthma & inguinal hernia repair admitted after an apparent seizure. Patient reports waking and felt normal on day of admission.  He reports he ate breakfast and then went to work as usual.  He was using a pack leaf blower when he felt hot and flushed with vision changes.  The next thing he remembers is waking with EMS.   Bystander reports of 3-4 minute "full body shaking".  No loss of bowel or bladder function. On arrival to ER, he was alert but somnolent and exam notable for blood in the R ear.   ER work up notable for CT of head / neck that was positive for acute temporal bone fx & epidural hematoma.  He also had an episode of small volume vomiting in ER.    He denies headache prior to the event, n/v/d, fevers, chills, chest pain, prior syncopal episodes, hypoglycemia sx and drug / ETOH abuse. He specifically denies recent trauma, altercation etc.     PAST MEDICAL HISTORY :   has a past medical history of Asthma.  has past surgical history that includes Hernia repair and hernia.    HOME MEDICATIONS: Prior to Admission medications   Medication Sig Start Date  End Date Taking? Authorizing Provider  acetaminophen (TYLENOL) 500 MG tablet Take 1,000 mg by mouth every 6 (six) hours as needed. Pain.   Yes Historical Provider, MD  cyclobenzaprine (FLEXERIL) 10 MG tablet Take 1 tablet (10 mg total) by mouth 3 (three) times daily as needed. Patient not taking: Reported on 03/04/2014 06/13/13   Tammy L. Triplett, PA-C  ibuprofen (ADVIL,MOTRIN) 600 MG tablet Take 1 tablet (600 mg total) by mouth every 8 (eight) hours as needed. Patient not taking: Reported on 03/04/2014 03/06/13   Lyanne CoKevin M Campos, MD  ibuprofen (ADVIL,MOTRIN) 800 MG tablet Take 1 tablet (800 mg total) by mouth 3 (three) times daily. Patient not taking: Reported on 03/04/2014 10/31/12   Tammy L. Triplett, PA-C  naproxen (NAPROSYN) 500 MG tablet Take 1 tablet (500 mg total) by mouth 2 (two) times daily. Patient not taking: Reported on 03/04/2014 06/13/13   Tammy L. Triplett, PA-C  naproxen (NAPROSYN) 500 MG tablet Take 1 tablet (500 mg total) by mouth 2 (two) times daily with a meal. Patient not taking: Reported on 03/04/2014 07/01/13   Vida RollerBrian D Miller, MD  oxyCODONE-acetaminophen (PERCOCET/ROXICET) 5-325 MG per tablet Take 1 tablet by mouth every 4 (four) hours as needed for pain. Patient not taking: Reported on 03/04/2014 10/31/12   Tammy L. Triplett, PA-C  oxyCODONE-acetaminophen (PERCOCET/ROXICET) 5-325 MG  per tablet Take 1 tablet by mouth every 4 (four) hours as needed for severe pain. Patient not taking: Reported on 03/04/2014 05/29/13   Tammy L. Triplett, PA-C  oxyCODONE-acetaminophen (PERCOCET/ROXICET) 5-325 MG per tablet Take 1 tablet by mouth every 4 (four) hours as needed for severe pain. Patient not taking: Reported on 03/04/2014 06/13/13   Tammy L. Triplett, PA-C  penicillin v potassium (VEETID) 500 MG tablet Take 1 tablet (500 mg total) by mouth 4 (four) times daily. Patient not taking: Reported on 03/04/2014 03/06/13   Lyanne Co, MD   Allergies  Allergen Reactions  . Hydrocodone Hives and Itching     FAMILY HISTORY:  has no family status information on file.    SOCIAL HISTORY:  reports that he has never smoked. He does not have any smokeless tobacco history on file. He reports that he does not drink alcohol or use illicit drugs.  REVIEW OF SYSTEMS:   Gen: Denies fever, chills, weight change, fatigue, night sweats HEENT: Denies blurred vision, double vision, hearing loss, tinnitus, sinus congestion, rhinorrhea, sore throat, neck stiffness, dysphagia.  Reports neck soreness, headache and vision change prior to collapse.  PULM: Denies shortness of breath, cough, sputum production, hemoptysis, wheezing CV: Denies chest pain, edema, orthopnea, paroxysmal nocturnal dyspnea, palpitations GI: Denies abdominal pain, nausea, vomiting, diarrhea, hematochezia, melena, constipation, change in bowel habits GU: Denies dysuria, hematuria, polyuria, oliguria, urethral discharge Endocrine: Denies hot or cold intolerance, polyuria, polyphagia or appetite change Derm: Denies rash, dry skin, scaling or peeling skin change Heme: Denies easy bruising, bleeding, bleeding gums Neuro: Denies numbness, weakness, slurred speech, loss of memory or consciousness.  Reports headache, no memory of incident.     SUBJECTIVE: Pt c/o headache  VITAL SIGNS: Temp:  [97.7 F (36.5 C)-98.6 F (37 C)] 98.6 F (37 C) (12/08 1440) Pulse Rate:  [98] 98 (12/08 1252) Resp:  [15] 15 (12/08 1252) BP: (125)/(85) 125/85 mmHg (12/08 1252) SpO2:  [99 %-100 %] 99 % (12/08 1252) Weight:  [145 lb (65.772 kg)] 145 lb (65.772 kg) (12/08 1252)   HEMODYNAMICS:     VENTILATOR SETTINGS:     INTAKE / OUTPUT: No intake or output data in the 24 hours ending 03/04/14 1518  PHYSICAL EXAMINATION: General:  Thin adult male, appears uncomfortable Neuro:  AAOx4, speech clear, MAE, grips equal / symmetrical, pupils 4mm =R.  Cervical tenderness on exam.  HEENT:  Mm pink/moist, no jvd.  Small amt blood in R ear Cardiovascular:  s1s2  regular, ? Pause, no jvd Lungs:  resp's even/non-labored, lungs bilaterally clear  Abdomen:  Flat, soft, bsx4 hypoactive   Musculoskeletal:  No acute deformities Skin:  Warm/dry, no edema, abrasion to L elbow with bruising  LABS:  CBC  Recent Labs Lab 03/04/14 1306  WBC 5.4  HGB 12.7*  HCT 37.5*  PLT 190   Coag's No results for input(s): APTT, INR in the last 168 hours.   BMET  Recent Labs Lab 03/04/14 1306  NA 139  K 4.1  CL 101  CO2 21  BUN 13  CREATININE 0.92  GLUCOSE 105*   Electrolytes  Recent Labs Lab 03/04/14 1306  CALCIUM 9.8   Liver Enzymes No results for input(s): AST, ALT, ALKPHOS, BILITOT, ALBUMIN in the last 168 hours.   Cardiac Enzymes No results for input(s): TROPONINI, PROBNP in the last 168 hours.   Glucose  Recent Labs Lab 03/04/14 1446  GLUCAP 105*    Imaging No results found.   ASSESSMENT / PLAN:  NEUROLOGIC A:  Acute Right Temporal Fracture - pt denies acute trauma / altercations prior to collapse.  Acute Right Epidural Hematoma Questionable seizure activity - no reported hx of seizures, no mass on CT.  Pain / Headache - in setting of epidural hematoma  P:   Seizure precautions NSGY evaluation  Keppra 1000 mg IV once then 500 mg BID UDS EEG Cardiac work up as below Insurance claims handlerMaintain C-Collar with cervical tenderness on exam  Neuro checks Q4   CARDIOVASCULAR A:  Syncope vs seizure - r/o cardiac source of collapse. ? LVH on EKG P:  Assess ECHO Repeat EKG in am  Assess CXR x1 to ensure no mass / obvious source of collapse   GASTROINTESTIONAL A: Nausea / Vomiting  P: PRN zofran Aspiration precautions NPO for now, ok for occasional ice chip  FAMILY  - Updates: Updated patient & fiancee at bedside.  Canary BrimBrandi Ollis, NP-C [facility] Pgr: [phone] or [phone]   03/04/2014, 3:18 PM   PCCM ATTENDING: Pt seen on work rounds with care provider noted above. We reviewed pt's initial  presentation, consultants notes and hospital database in detail.  The above assessment and plan was formulated under my direction.   Billy Fischeravid Shatika Grinnell, MD;  PCCM service; Mobile [phone]

## 2014-03-04 NOTE — ED Notes (Signed)
Marcus Mora (fiancee') [phone]

## 2014-03-04 NOTE — ED Notes (Signed)
Pt vomited x1 22600ml.streaks of blood noted. MD aware

## 2014-03-04 NOTE — ED Notes (Signed)
Patient transported to CT 

## 2014-03-04 NOTE — Op Note (Signed)
Date of procedure: 03/04/2014  Date of dictation: Same  Service: Neurosurgery  Preoperative diagnosis: Right temporal parietal epidural hematoma, posttraumatic  Postoperative diagnosis: Same  Procedure Name: Right temporal parietal craniotomy and evacuation of epidural hematoma  Surgeon:Miguelangel Korn A.Jayah Balthazar, M.D.  Asst. Surgeon: None  Anesthesia: General  Indication: 28 year old male status post traumatic brain injury with right temporal parietal fracture and underlying epidural. Epidural initially small but significantly larger on follow-up head CT scan 8 hours later. Patient taken to the operating room for evacuation of hematoma  Operative note: After induction of anesthesia, patient position supine with his neck turned towards the left in head fixed in the [place] pin headrest. Patient's scalp prepped and draped. A U-shaped incision was performed overlying his right ear. Incision was carried down through the scalp and underlying temporalis muscle. Flap was mobilized and retracted inferiorly. Temporal parietal bone fracture readily apparent. Bur hole made. Craniotomy flap then performed using high-speed drill. Bone removed in 2 pieces. Underlying epidural hematoma clearly identified. Blood clot removed using gentle suction and debridement. All bleeding points along the dura controlled with bipolar electrocautery. Tack up holes made around the periphery of the craniotomy. Dura tacked up along the periphery of the craniotomy using the tack up holes. Hemostasis was excellent. No evidence of residual epidural hematoma nor is there any active bleeding. Fracture site reconstructed using titanium plates and craniotomy flap then plated back into position. Wound irrigated one final time. Temporalis muscle closed using 2-0 Vicryl sutures. Scalp closed with 2-0 Vicryl sutures the galea. Staples were applied to the surface. No apparent complications. Patient tolerated the procedure well and he returns to the  recovery room postop.

## 2014-03-04 NOTE — Anesthesia Preprocedure Evaluation (Addendum)
Anesthesia Evaluation  Patient identified by MRN, date of birth, ID band Patient awake    Reviewed: Allergy & Precautions, H&P , NPO status , Patient's Chart, lab work & pertinent test results  History of Anesthesia Complications Negative for: history of anesthetic complications  Airway Mallampati: I  TM Distance: >3 FB Neck ROM: Full    Dental   Pulmonary asthma ,  childhood         Cardiovascular negative cardio ROS      Neuro/Psych negative neurological ROS     GI/Hepatic negative GI ROS, Neg liver ROS,   Endo/Other  negative endocrine ROS  Renal/GU negative Renal ROS     Musculoskeletal negative musculoskeletal ROS (+)   Abdominal   Peds  Hematology negative hematology ROS (+)   Anesthesia Other Findings   Reproductive/Obstetrics                            Anesthesia Physical Anesthesia Plan  ASA: III and emergent  Anesthesia Plan: General   Post-op Pain Management:    Induction: Intravenous  Airway Management Planned: Oral ETT  Additional Equipment: Arterial line  Intra-op Plan:   Post-operative Plan: Extubation in OR  Informed Consent: I have reviewed the patients History and Physical, chart, labs and discussed the procedure including the risks, benefits and alternatives for the proposed anesthesia with the patient or authorized representative who has indicated his/her understanding and acceptance.     Plan Discussed with: CRNA and Surgeon  Anesthesia Plan Comments:         Anesthesia Quick Evaluation

## 2014-03-04 NOTE — ED Notes (Signed)
Pt noted to have blood to rt ear. Ear cleaned and gauze applied prior to transport to CT.

## 2014-03-04 NOTE — Progress Notes (Signed)
EEG completed; results pending.    

## 2014-03-05 ENCOUNTER — Encounter (HOSPITAL_COMMUNITY): Payer: Self-pay | Admitting: Neurosurgery

## 2014-03-05 DIAGNOSIS — S0210XS Unspecified fracture of base of skull, sequela: Secondary | ICD-10-CM

## 2014-03-05 DIAGNOSIS — I369 Nonrheumatic tricuspid valve disorder, unspecified: Secondary | ICD-10-CM

## 2014-03-05 LAB — BASIC METABOLIC PANEL
ANION GAP: 16 — AB (ref 5–15)
BUN: 11 mg/dL (ref 6–23)
CHLORIDE: 102 meq/L (ref 96–112)
CO2: 23 meq/L (ref 19–32)
Calcium: 9.2 mg/dL (ref 8.4–10.5)
Creatinine, Ser: 0.88 mg/dL (ref 0.50–1.35)
GFR calc Af Amer: >90 mL/min (ref >90)
GFR calc non Af Amer: >90 mL/min (ref >90)
Glucose, Bld: 110 mg/dL — ABNORMAL HIGH (ref 70–99)
POTASSIUM: 4.4 meq/L (ref 3.7–5.3)
Sodium: 141 mEq/L (ref 137–147)

## 2014-03-05 LAB — MRSA PCR SCREENING: MRSA BY PCR: NEGATIVE

## 2014-03-05 LAB — CBC
HCT: 36 % — ABNORMAL LOW (ref 39.0–52.0)
HEMOGLOBIN: 11.7 g/dL — AB (ref 13.0–17.0)
MCH: 28.3 pg (ref 26.0–34.0)
MCHC: 32.5 g/dL (ref 30.0–36.0)
MCV: 87 fL (ref 78.0–100.0)
PLATELETS: 196 10*3/uL (ref 150–400)
RBC: 4.14 MIL/uL — AB (ref 4.22–5.81)
RDW: 12.9 % (ref 11.5–15.5)
WBC: 9.3 10*3/uL (ref 4.0–10.5)

## 2014-03-05 MED ORDER — OXYCODONE-ACETAMINOPHEN 5-325 MG PO TABS
1.0000 | ORAL_TABLET | Freq: Four times a day (QID) | ORAL | Status: DC | PRN
  Administered 2014-03-05 – 2014-03-07 (×6): 2 via ORAL
  Administered 2014-03-07: 1 via ORAL
  Administered 2014-03-07: 2 via ORAL
  Filled 2014-03-05 (×8): qty 2

## 2014-03-05 MED ORDER — INFLUENZA VAC SPLIT QUAD 0.5 ML IM SUSY: 0.5000 mL | PREFILLED_SYRINGE | INTRAMUSCULAR | Status: DC | PRN

## 2014-03-05 MED ORDER — CEFAZOLIN SODIUM-DEXTROSE 2-3 GM-% IV SOLR
2.0000 g | Freq: Three times a day (TID) | INTRAVENOUS | Status: AC
  Administered 2014-03-05 (×3): 2 g via INTRAVENOUS
  Filled 2014-03-05 (×4): qty 50

## 2014-03-05 NOTE — Plan of Care (Signed)
Problem: Consults Goal: Craniotomy Patient Education See Patient Education Module for education specifics.  Outcome: Completed/Met Date Met:  03/05/14 Goal: Diagnosis - Craniotomy Outcome: Completed/Met Date Met:  03/05/14 Subdural hematoma- epidural Goal: Skin Care Protocol Initiated - if Braden Score 18 or less If consults are not indicated, leave blank or document N/A  Outcome: Not Applicable Date Met:  98/47/30 Goal: Nutrition Consult-if indicated Outcome: Not Applicable Date Met:  85/69/43 Goal: Diabetes Guidelines if Diabetic/Glucose > 140 If diabetic or lab glucose is > 140 mg/dl - Initiate Diabetes/Hyperglycemia Guidelines & Document Interventions  Outcome: Not Applicable Date Met:  70/05/25  Problem: Phase I Progression Outcomes Goal: Neuro exam at baseline or improved Outcome: Completed/Met Date Met:  03/05/14 Goal: Hemodynamically stable Outcome: Completed/Met Date Met:  03/05/14 Goal: Initial discharge plan identified Outcome: Completed/Met Date Met:  03/05/14 Goal: Other Phase I Outcomes/Goals Outcome: Not Applicable Date Met:  91/02/89  Problem: Phase II Progression Outcomes Goal: Neuro exam at baseline or improved Outcome: Completed/Met Date Met:  03/05/14 Goal: Hemodynamically stable Outcome: Completed/Met Date Met:  03/05/14

## 2014-03-05 NOTE — Progress Notes (Signed)
  Echocardiogram 2D Echocardiogram has been performed.  Cathie BeamsGREGORY, [place] Huntsman 03/05/2014, 9:57 AM

## 2014-03-05 NOTE — Progress Notes (Signed)
Postoperative 1. Patient complains of headache. Much more awake and interactive than preop however. No complaints of numbness, tingling or weakness.  No seizure activity observed. Afebrile. Vital signs stable. Patient awake and alert. Oriented and appropriate. Motor 5/5 bilaterally. No pronator drift. Wound dressing clean and dry.  Status post craniotomy for epidural hematoma. Overall doing well. May begin diet and mobilization today. Fine from my standpoint to be transferred to floor.

## 2014-03-05 NOTE — Progress Notes (Signed)
UR completed.  Tierre Netto, RN BSN [facility] CCM Trauma/Neuro ICU Case Manager [phone]  

## 2014-03-05 NOTE — Progress Notes (Signed)
PULMONARY / CRITICAL CARE MEDICINE   Name: Marcus CrumbleRobert W Mora MRN: [id number] DOB: 11/26/1985    ADMISSION DATE:  03/04/2014  REFERRING MD :  Dr. Judd Lienelo  CHIEF COMPLAINT:  EDH   INITIAL PRESENTATION: 28 y/o M with a PMH of asthma admitted after an apparent seizure (no hx).  CT head positive for temporal bone fx & epidural hematoma.   PCCM consulted for ICU admission.   STUDIES:  12/08  CT Head >> subtle fx of R temporal bone with underlying temporal epidural hematoma (9mm x 4.5 cm), no midline shift, scalp contusion over R temporal bone & R posterior parietal bone, blood in mastoid air cells and R middle ear 12/08  CT Cervical Spine >> nml   SIGNIFICANT EVENTS: 12/08  Admit after ? Seizure, collapse at work.  CT head positive for epidural hematoma and temporal fracture 12/09   SUBJECTIVE: Pt c/o headache  VITAL SIGNS: Temp:  [97.6 F (36.4 C)-98.6 F (37 C)] 98.2 F (36.8 C) (12/09 0756) Pulse Rate:  [46-104] 46 (12/09 0756) Resp:  [9-21] 14 (12/09 0756) BP: (104-135)/(53-87) 104/75 mmHg (12/09 0756) SpO2:  [96 %-100 %] 100 % (12/09 0756) Arterial Line BP: (145-194)/(61-90) 175/85 mmHg (12/09 0600) Weight:  [64.3 kg (141 lb 12.1 oz)-65.772 kg (145 lb)] 64.3 kg (141 lb 12.1 oz) (12/09 0455)   HEMODYNAMICS:     VENTILATOR SETTINGS:     INTAKE / OUTPUT:  Intake/Output Summary (Last 24 hours) at 03/05/14 0901 Last data filed at 03/05/14 0800  Gross per 24 hour  Intake   1850 ml  Output   1030 ml  Net    820 ml    PHYSICAL EXAMINATION: General:  Thin adult male, appears uncomfortable Neuro:  AAOx4, speech clear, MAE, grips equal / symmetrical, pupils 4mm =R.  Cervical tenderness on exam.  HEENT:  Mm pink/moist, no jvd.  Small amt blood in R ear Cardiovascular:  s1s2 regular, ? Pause, no jvd Lungs:  resp's even/non-labored, lungs bilaterally clear  Abdomen:  Flat, soft, bsx4 hypoactive   Musculoskeletal:  No acute deformities Skin:  Warm/dry, no edema, abrasion to L  elbow with bruising  LABS:  CBC  Recent Labs Lab 03/04/14 1306 03/05/14 0205  WBC 5.4 9.3  HGB 12.7* 11.7*  HCT 37.5* 36.0*  PLT 190 196   Coag's No results for input(s): APTT, INR in the last 168 hours.   BMET  Recent Labs Lab 03/04/14 1306 03/05/14 0205  NA 139 141  K 4.1 4.4  CL 101 102  CO2 21 23  BUN 13 11  CREATININE 0.92 0.88  GLUCOSE 105* 110*   Electrolytes  Recent Labs Lab 03/04/14 1306 03/05/14 0205  CALCIUM 9.8 9.2   Liver Enzymes No results for input(s): AST, ALT, ALKPHOS, BILITOT, ALBUMIN in the last 168 hours.   Cardiac Enzymes No results for input(s): TROPONINI, PROBNP in the last 168 hours.   Glucose  Recent Labs Lab 03/04/14 1446  GLUCAP 105*    Imaging Ct Head Wo Contrast  03/04/2014   CLINICAL DATA:  Continued surveillance epidural hematoma. Previous syncopal episode with fall, striking head. Right-sided headache.  EXAM: CT HEAD WITHOUT CONTRAST  TECHNIQUE: Contiguous axial images were obtained from the base of the skull through the vertex without contrast.  COMPARISON:  CT head earlier today at 1400 hr  FINDINGS: The previously identified RIGHT middle cranial fossa epidural hematoma is increasing in size, now with cross-sectional measurements of 15 x 61 mm as compared with 8 x  39 mm previously. There is mass effect on the RIGHT temporal lobe and gentle medial RIGHT uncal displacement. Minimal pneumocephalus. Large scalp hematoma. At the level of the septum pellucidum there is 2 mm of right-to-left shift. Unchanged RIGHT temporal bone fracture extending to the mastoid.  IMPRESSION: Enlarging RIGHT epidural hematoma. I discussed the findings with Dr. Dutch Quint at the time of interpretation.   Electronically Signed   By: Davonna Belling M.D.   On: 03/04/2014 20:47   Ct Head Wo Contrast   (if New Onset Seizure And/or Head Trauma)  03/04/2014   CLINICAL DATA:  Fall with head trauma and seizure.  EXAM: CT HEAD WITHOUT CONTRAST  CT CERVICAL SPINE  WITHOUT CONTRAST  TECHNIQUE: Multidetector CT imaging of the head and cervical spine was performed following the standard protocol without intravenous contrast. Multiplanar CT image reconstructions of the cervical spine were also generated.  COMPARISON:  None.  FINDINGS: CT HEAD FINDINGS  There is a subtle fracture through the right temporal bone with an underlying small right temporal epidural hematoma measuring 9 mm in thickness and approximately 4.5 cm in length. There is a small amount of subdural hemorrhage extending superiorly over the right posterior temporal lobe extending over the right posterior temporal lobe. There is no midline shift.  The fracture extends into the right temporal fossa through the anterior aspect of the mastoid process. There is blood in the mastoid air cells and right middle ear cavity.  There is a scalp contusion over the right temporal bone and right posterior parietal bone.  There is an air-fluid level and mucosal thickening in the right maxillary sinus consistent with sinusitis. Retention cyst in the sphenoid sinus.  CT CERVICAL SPINE FINDINGS  There is no fracture, subluxation, prevertebral soft tissue swelling, degenerative disc disease or facet arthritis, or other abnormality of the cervical spine.  IMPRESSION: 1. Acute right temporal epidural hematoma with a nondisplaced fracture through the right temporal bone . There is an adjacent small subdural hematoma. No local mass effect or midline shift. Blood in the right mastoid air cells and right middle ear cavity secondary to the fracture extending through the mastoid process. 2. Normal CT scan of the cervical spine. Critical Value/emergent results were called by telephone at the time of interpretation on 03/04/2014 at 2:24 pm to Dr. Geoffery Lyons , who verbally acknowledged these results.   Electronically Signed   By: Geanie Cooley M.D.   On: 03/04/2014 14:39   Ct Cervical Spine Wo Contrast  03/04/2014   CLINICAL DATA:  Fall with  head trauma and seizure.  EXAM: CT HEAD WITHOUT CONTRAST  CT CERVICAL SPINE WITHOUT CONTRAST  TECHNIQUE: Multidetector CT imaging of the head and cervical spine was performed following the standard protocol without intravenous contrast. Multiplanar CT image reconstructions of the cervical spine were also generated.  COMPARISON:  None.  FINDINGS: CT HEAD FINDINGS  There is a subtle fracture through the right temporal bone with an underlying small right temporal epidural hematoma measuring 9 mm in thickness and approximately 4.5 cm in length. There is a small amount of subdural hemorrhage extending superiorly over the right posterior temporal lobe extending over the right posterior temporal lobe. There is no midline shift.  The fracture extends into the right temporal fossa through the anterior aspect of the mastoid process. There is blood in the mastoid air cells and right middle ear cavity.  There is a scalp contusion over the right temporal bone and right posterior parietal bone.  There is  an air-fluid level and mucosal thickening in the right maxillary sinus consistent with sinusitis. Retention cyst in the sphenoid sinus.  CT CERVICAL SPINE FINDINGS  There is no fracture, subluxation, prevertebral soft tissue swelling, degenerative disc disease or facet arthritis, or other abnormality of the cervical spine.  IMPRESSION: 1. Acute right temporal epidural hematoma with a nondisplaced fracture through the right temporal bone . There is an adjacent small subdural hematoma. No local mass effect or midline shift. Blood in the right mastoid air cells and right middle ear cavity secondary to the fracture extending through the mastoid process. 2. Normal CT scan of the cervical spine. Critical Value/emergent results were called by telephone at the time of interpretation on 03/04/2014 at 2:24 pm to Dr. Geoffery LyonsUGLAS DELO , who verbally acknowledged these results.   Electronically Signed   By: Geanie CooleyJim  Maxwell M.D.   On: 03/04/2014 14:39    Dg Chest Port 1 View  03/04/2014   CLINICAL DATA:  28 year old male with fall, seizure and syncope. Initial encounter.  EXAM: PORTABLE CHEST - 1 VIEW  [facility].  FINDINGS: Portable AP view at 1707 hrs. Lower lung volumes. Allowing for this cardiac and mediastinal contours are within normal limits. No pneumothorax or pleural effusion. Allowing for portable technique, the lungs are clear. No acute osseous abnormality identified.  IMPRESSION: No acute cardiopulmonary abnormality.   Electronically Signed   By: Augusto GambleLee  Hall M.D.   On: 03/04/2014 17:23     ASSESSMENT / PLAN:   NEUROLOGIC A:  Acute Right Temporal Fracture - pt denies acute trauma / altercations prior to collapse.  Acute Right Epidural Hematoma Questionable seizure activity - no reported hx of seizures, no mass on CT.  Pain / Headache - in setting of epidural hematoma  P:   Seizure precautions NSGY recommends transfer Keppra 1000 mg IV once then 500 mg BID EEG per neuro Cardiac work up as below Neuro checks Q4   CARDIOVASCULAR A:  Syncope vs seizure - r/o cardiac source of collapse. ? LVH on EKG P:  F/U ECHO as ordered.  GASTROINTESTIONAL A: Nausea / Vomiting  P: PRN zofran Regular diet.  FAMILY  - Updates: Updated patient & fiancee at bedside.  [facility]  PCCM will sign off, please call back if needed.  Alyson ReedyWesam G. Edina Winningham, M.D. [facility]monary/Critical [facility] Medicine. Pager: [phone]. After hours pager: [phone].  03/05/2014, 9:01 AM

## 2014-03-05 NOTE — Plan of Care (Signed)
Problem: Phase I Progression Outcomes Goal: Neuro exam at baseline or improved Outcome: Progressing Goal: Respiratory status stable Outcome: Completed/Met Date Met:  03/05/14 Goal: Pain controlled with appropriate interventions Outcome: Progressing Goal: Bedrest HOB at 30 degrees Outcome: Completed/Met Date Met:  03/05/14 Goal: Voiding-avoid urinary catheter unless indicated Outcome: Progressing  Problem: Phase II Progression Outcomes Goal: Extubated and maintains O2 sats > 92% Outcome: Completed/Met Date Met:  03/05/14

## 2014-03-06 DIAGNOSIS — S064X9A Epidural hemorrhage with loss of consciousness of unspecified duration, initial encounter: Secondary | ICD-10-CM

## 2014-03-06 DIAGNOSIS — R55 Syncope and collapse: Secondary | ICD-10-CM

## 2014-03-06 DIAGNOSIS — R51 Headache: Secondary | ICD-10-CM

## 2014-03-06 DIAGNOSIS — S064XAA Epidural hemorrhage with loss of consciousness status unknown, initial encounter: Secondary | ICD-10-CM

## 2014-03-06 DIAGNOSIS — R569 Unspecified convulsions: Secondary | ICD-10-CM

## 2014-03-06 DIAGNOSIS — S064X1A Epidural hemorrhage with loss of consciousness of 30 minutes or less, initial encounter: Secondary | ICD-10-CM

## 2014-03-06 DIAGNOSIS — R519 Headache, unspecified: Secondary | ICD-10-CM

## 2014-03-06 LAB — BASIC METABOLIC PANEL
ANION GAP: 15 (ref 5–15)
BUN: 8 mg/dL (ref 6–23)
CALCIUM: 9.4 mg/dL (ref 8.4–10.5)
CO2: 25 mEq/L (ref 19–32)
Chloride: 96 mEq/L (ref 96–112)
Creatinine, Ser: 0.71 mg/dL (ref 0.50–1.35)
GFR calc Af Amer: >90 mL/min (ref >90)
GFR calc non Af Amer: >90 mL/min (ref >90)
Glucose, Bld: 98 mg/dL (ref 70–99)
POTASSIUM: 3.8 meq/L (ref 3.7–5.3)
Sodium: 136 mEq/L — ABNORMAL LOW (ref 137–147)

## 2014-03-06 LAB — CBC
HCT: 35.2 % — ABNORMAL LOW (ref 39.0–52.0)
Hemoglobin: 12.1 g/dL — ABNORMAL LOW (ref 13.0–17.0)
MCH: 30 pg (ref 26.0–34.0)
MCHC: 34.4 g/dL (ref 30.0–36.0)
MCV: 87.3 fL (ref 78.0–100.0)
PLATELETS: 169 10*3/uL (ref 150–400)
RBC: 4.03 MIL/uL — ABNORMAL LOW (ref 4.22–5.81)
RDW: 12.4 % (ref 11.5–15.5)
WBC: 6.9 10*3/uL (ref 4.0–10.5)

## 2014-03-06 LAB — PHOSPHORUS: Phosphorus: 1.9 mg/dL — ABNORMAL LOW (ref 2.3–4.6)

## 2014-03-06 LAB — MAGNESIUM: MAGNESIUM: 1.8 mg/dL (ref 1.5–2.5)

## 2014-03-06 NOTE — Progress Notes (Signed)
Postop day 2. Overall patient doing well. Still having headache. Does note some dizziness with changing position. 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  Awake and alert. Oriented and reasonably appropriate. Motor and sensory function intact. Cranial nerve function intact except for hearing loss in right ear. Obvious nystagmus. Motor and sensory function of the extremities normal. Wound clean and dry.  Overall doing well following craniotomy for epidural hematoma. Continue efforts at mobilization.

## 2014-03-06 NOTE — Progress Notes (Signed)
[place] TEAM 1 - Stepdown/ICU TEAM Progress Note  Juanda CrumbleRobert W Ratledge ZOX:[id number] DOB: 04/23/1985 DOA: 03/04/2014 PCP: Lilyan PuntLUKING,SCOTT, MD  Admit HPI / Brief Narrative: 28 y/o WM PMHx childhood asthma & inguinal hernia repair admitted after an apparent seizure.  Patient reports waking and felt normal on day of admission. He reports he ate breakfast and then went to work as usual. He was using a pack leaf blower when he felt hot and flushed with vision changes. The next thing he remembers is waking with EMS. Bystander reports of 3-4 minute "full body shaking". No loss of bowel or bladder function. On arrival to ER, he was alert but somnolent and exam notable for blood in the R ear.   ER work up notable for CT of head / neck that was positive for acute temporal bone fx & epidural hematoma. He also had an episode of small volume vomiting in ER.   He denies headache prior to the event, n/v/d, fevers, chills, chest pain, prior syncopal episodes, hypoglycemia sx and drug / ETOH abuse. He specifically denies recent trauma, altercation etc.   HPI/Subjective: 12/10 A/O 4, NAD, negative N/V, negative CP, negative SOB, positive headache. States he believes he passed out and struck his head on the curb. Next thing he remembers is his coworker standing over him telling him the EMS was on the way. Believes it may have been from dehydration as he was on his way to drink some fluids.   Assessment/Plan: Acute Right Temporal Fracture  - Collapse while outside working at his job believes struck his head on the curb.  -S/P Right temporal parietal craniotomy and evacuation of epidural hematoma; see notes below   Acute Right Epidural Hematoma -See acute right temporal fracture  Seizures, - Patient with no previous history of seizure   -EEG normal -We'll continue Keppra 500 mg BID per neurology -Continue seizure precautions  Syncope and collapse -EEG and echocardiogram nondiagnostic -UDS  positive for benzodiazepine (was this drawn prior to administration of benzodiazepines at hospital?)  Pain / Headache (setting of epidural hematoma)  -Continue pain regimen per neurosurgery    Code Status: FULL Family Communication: family present at time of exam Disposition Plan: Per neurosurgery    Consultants: Dr. Billy Fischeravid Simonds (PCCM) Dr. Julio SicksHenry Pool (neurosurgery) Dr. Wyatt Portelasvaldo Camilo (neurology)    Procedure/Significant Events: 12/08 Admit after ? Seizure, collapse at work. CT head positive for epidural hematoma and temporal fracture 12/8 CT head without contrast;-previously identified RIGHT middle cranial fossa epidural hematoma increasing in size; 15 x 61 mm as compared with 8 x 39 mm previously. -mass effect on RIGHT temporal lobe and gentle medial RIGHT uncal displacement. Minimal pneumocephalus.  -At the level of the septum pellucidum there is 2 mm right-to-left shift.  -Unchanged RIGHT temporal bone fracture extending to the mastoid.  12/8 EEG; normal awake and asleep EEG.  12/8;Right temporal parietal craniotomy and evacuation of epidural hematoma;Fracture site reconstructed using titanium plates and craniotomy flap  12/9 echocardiogram;- Left ventricle:  Mild concentric hypertrophy. -LVEF= 65% to 70%.     Culture NA  Antibiotics: Cefazolin 3 doses  DVT prophylaxis: SCD   Devices NA   LINES / TUBES:      Continuous Infusions: . sodium chloride 50 mL/hr at 03/05/14 1354    Objective: VITAL SIGNS: Temp: 98.1 F (36.7 C) (12/10 1210) Temp Source: Oral (12/10 1210) BP: 123/69 mmHg (12/10 1148) Pulse Rate: 50 (12/10 1148) SPO2; FIO2:   Intake/Output Summary (Last 24 hours) at 03/06/14 1427  Last data filed at 03/06/14 1400  Gross per 24 hour  Intake 1312.5 ml  Output   1050 ml  Net  262.5 ml     Exam: General: A/O 4, NAD, No acute respiratory distress Lungs: Clear to auscultation bilaterally without wheezes or  crackles Cardiovascular: Regular rate and rhythm without murmur gallop or rub normal S1 and S2 Abdomen: Nontender, nondistended, soft, bowel sounds positive, no rebound, no ascites, no appreciable mass Extremities: No significant cyanosis, clubbing, or edema bilateral lower extremities Neurologic; cranial nerves II through XII intact, tongue/uvula midline, extremity strength 5/5, sensation intact throughout, positive horizontal nystagmus, quick finger touches within normal limit bilateral, finger nose finger within normal limit bilateral, did not ambulate patient (patient states ambulated with physical therapy without problem) Data Reviewed: Basic Metabolic Panel:  Recent Labs Lab 03/04/14 1306 03/05/14 0205 03/06/14 0408  NA 139 141 136*  K 4.1 4.4 3.8  CL 101 102 96  CO2 21 23 25   GLUCOSE 105* 110* 98  BUN 13 11 8   CREATININE 0.92 0.88 0.71  CALCIUM 9.8 9.2 9.4  MG  --   --  1.8  PHOS  --   --  1.9*   Liver Function Tests: No results for input(s): AST, ALT, ALKPHOS, BILITOT, PROT, ALBUMIN in the last 168 hours. No results for input(s): LIPASE, AMYLASE in the last 168 hours. No results for input(s): AMMONIA in the last 168 hours. CBC:  Recent Labs Lab 03/04/14 1306 03/05/14 0205 03/06/14 0408  WBC 5.4 9.3 6.9  HGB 12.7* 11.7* 12.1*  HCT 37.5* 36.0* 35.2*  MCV 85.0 87.0 87.3  PLT 190 196 169   Cardiac Enzymes: No results for input(s): CKTOTAL, CKMB, CKMBINDEX, TROPONINI in the last 168 hours. BNP (last 3 results) No results for input(s): PROBNP in the last 8760 hours. CBG:  Recent Labs Lab 03/04/14 1446  GLUCAP 105*    Recent Results (from the past 240 hour(s))  MRSA PCR Screening     Status: None   Collection Time: 03/05/14 12:21 AM  Result Value Ref Range Status   MRSA by PCR NEGATIVE NEGATIVE Final    Comment:        The GeneXpert MRSA Assay (FDA approved for NASAL specimens only), is one component of a comprehensive MRSA colonization surveillance  program. It is not intended to diagnose MRSA infection nor to guide or monitor treatment for MRSA infections.      Studies:  Recent x-ray studies have been reviewed in detail by the Attending Physician  Scheduled Meds:  Scheduled Meds: . levETIRAcetam  500 mg Intravenous Q12H    Time spent on care of this patient: 40 mins   Drema DallasWOODS, CURTIS, J , MD   [facility] Office  [phone] Pager [phone]  On-Call/Text Page:      [email or web address]      password [email or web address]  If 7PM-7AM, please contact night-coverage [email or web address] Password [email or web address] 03/06/2014, 2:27 PM   LOS: 2 days

## 2014-03-06 NOTE — Addendum Note (Signed)
Addendum  created 03/06/14 1519 by Arta BruceKevin Donneisha Beane, MD   Modules edited: Anesthesia Attestations, Notes Section   Notes Section:  File: [id number]

## 2014-03-06 NOTE — Evaluation (Signed)
Physical Therapy Evaluation Patient Details Name: Marcus Mora MRN: [id number] DOB: 06/07/1985 Today's Date: 03/06/2014   History of Present Illness  Pt with seizure with no history of with epidural hematoma s/p craniotomy and right temporal bone fx  Clinical Impression  Patient is experiencing significant pain in his head but was agreeable to therapy.  He reported dizziness when turning head in bed and after moving supine-to-sit.  Dizziness persisted during ambulation, pt rated at 5/10 severity.  Educated on gaze stabilization while walking to help alleviate these symptoms, pt reports that this helped a little.  Dizziness rated 4/10 when seated in recliner at end of session.  Smooth pursuit and saccades slow but WFL in static environment.  Pt with at least 4+/5 strength in bilateral LE on myotome testing.  He was able to answer all cognitive questioning satisfactorily.  Patient with noted apathy toward physical therapy treatment and participation in plan of care decision making.  Pt would benefit from skilled therapy to address existing balance deficits in order to maximize independence and return to PLOF.       Follow Up Recommendations Other (comment) (Outpatient vestibular rehab)    Equipment Recommendations  None recommended by PT    Recommendations for Other Services OT consult     Precautions / Restrictions Precautions Precautions: Fall Restrictions Weight Bearing Restrictions: No      Mobility  Bed Mobility Overal bed mobility: Modified Independent             General bed mobility comments: Increased time to come from supine-to-sit due to dizziness and pain; able to complete transfer without physical assist  Transfers Overall transfer level: Needs assistance Equipment used: None Transfers: Sit to/from Stand Sit to Stand: Supervision         General transfer comment: Supervision for patient safety; pt able to stand without physical  assist  Ambulation/Gait Ambulation/Gait assistance: Supervision Ambulation Distance (Feet): 800 Feet Assistive device: None Gait Pattern/deviations: Step-through pattern (Decreased arm swing)     General Gait Details: Pt with increased dizziness while walking, educated on gaze stabilization while walking.  Supervision for patient safety.  Stairs            Wheelchair Mobility    Modified Rankin (Stroke Patients Only)       Balance Overall balance assessment: Needs assistance Sitting-balance support: No upper extremity supported;Feet supported Sitting balance-Leahy Scale: Normal     Standing balance support: No upper extremity supported;During functional activity Standing balance-Leahy Scale: Good                               Pertinent Vitals/Pain Pain Assessment: 0-10 Pain Score: 9  Pain Location: head Pain Descriptors / Indicators: Aching  SpO2 100% on RA throughout session Max HR during activity: 104    Home Living Family/patient expects to be discharged to:: Private residence Living Arrangements: Spouse/significant other Available Help at Discharge: Family;Available 24 hours/day (girlfriend and mom) Type of Home: House Home Access: Level entry     Home Layout: Two level;Able to live on main level with bedroom/bathroom Home Equipment: None Additional Comments: works in [employer]    Prior Function Level of Independence: Independent               Higher education careers adviserHand Dominance        Extremity/Trunk Assessment   Upper Extremity Assessment: Overall WFL for tasks assessed           Lower Extremity  Assessment: Overall WFL for tasks assessed      Cervical / Trunk Assessment: Normal  Communication   Communication: No difficulties  Cognition Arousal/Alertness: Awake/alert Behavior During Therapy: WFL for tasks assessed/performed Overall Cognitive Status: Within Functional Limits for tasks assessed                      General  Comments      Exercises        Assessment/Plan    PT Assessment Patient needs continued PT services  PT Diagnosis Acute pain;Difficulty walking (difficulty walking secondary to dizziness and feeling off-balance)   PT Problem List Decreased balance;Decreased mobility;Pain  PT Treatment Interventions Gait training;Functional mobility training;Therapeutic activities;Balance training;Patient/family education   PT Goals (Current goals can be found in the Care Plan section) Acute Rehab PT Goals Patient Stated Goal: to go home PT Goal Formulation: With patient Time For Goal Achievement: 03/20/14 Potential to Achieve Goals: Good    Frequency Min 3X/week   Barriers to discharge        Co-evaluation               End of Session   Activity Tolerance: Patient tolerated treatment well Patient left: in chair;with family/visitor present;with call bell/phone within reach Nurse Communication: Mobility status         Time: 1002-1019 PT Time Calculation (min) (ACUTE ONLY): 17 min   Charges:         PT G Codes:          Christianne Zacher SPT 03/06/2014, 10:57 AM

## 2014-03-06 NOTE — Anesthesia Postprocedure Evaluation (Signed)
Anesthesia Post Note  Patient: Marcus CrumbleRobert W Addison  Procedure(s) Performed: Procedure(s) (LRB): CRANIOTOMY HEMATOMA EVACUATION EPIDURAL (N/A)  Anesthesia type: general  Patient location: PACU  Post pain: Pain level controlled  Post assessment: Patient's Cardiovascular Status Stable  Last Vitals:  Filed Vitals:   03/06/14 1210  BP:   Pulse:   Temp: 36.7 C  Resp:     Post vital signs: Reviewed and stable  Level of consciousness: sedated  Complications: No apparent anesthesia complications

## 2014-03-07 DIAGNOSIS — S064X1D Epidural hemorrhage with loss of consciousness of 30 minutes or less, subsequent encounter: Secondary | ICD-10-CM

## 2014-03-07 MED ORDER — OXYCODONE HCL 5 MG PO TABS
5.0000 mg | ORAL_TABLET | ORAL | Status: DC | PRN
  Administered 2014-03-07 – 2014-03-08 (×3): 10 mg via ORAL
  Administered 2014-03-08: 5 mg via ORAL
  Administered 2014-03-08: 10 mg via ORAL
  Filled 2014-03-07 (×5): qty 2

## 2014-03-07 MED ORDER — TRAMADOL HCL 50 MG PO TABS
50.0000 mg | ORAL_TABLET | Freq: Four times a day (QID) | ORAL | Status: DC | PRN
  Administered 2014-03-08: 50 mg via ORAL
  Filled 2014-03-07: qty 1

## 2014-03-07 MED ORDER — ACETAMINOPHEN 325 MG PO TABS: 650.0000 mg | ORAL_TABLET | Freq: Four times a day (QID) | ORAL | Status: DC | PRN

## 2014-03-07 MED ORDER — LEVETIRACETAM 500 MG PO TABS
500.0000 mg | ORAL_TABLET | Freq: Two times a day (BID) | ORAL | Status: DC
  Administered 2014-03-08: 500 mg via ORAL
  Filled 2014-03-07 (×2): qty 1

## 2014-03-07 NOTE — Progress Notes (Signed)
Postoperative day 3. Patient doing very well. Headache well controlled. No seizures. No symptoms of weakness or sensory loss. Still feels a little unsteady on his feet. No other problems.  Afebrile. Vital stable. Wound healing well. Awake and alert. Oriented and appropriate. Cranial nerve function intact except right hearing loss stable. Motor 5/5 bilaterally. No pronator drift.  Doing well following craniotomy for epidural hematoma. Patient may be discharged when cleared from a medical standpoint. He should follow up in my office in 1 week for staple removal.

## 2014-03-07 NOTE — Progress Notes (Signed)
Physical Therapy Treatment Patient Details Name: Marcus Mora MRN: [id number] DOB: 07/08/1985 Today's Date: 03/07/2014    History of Present Illness Pt with seizure with no history of with epidural hematoma s/p craniotomy and right temporal bone fx    PT Comments    Patient improving some with symptoms with ambulation.  Did initiate vestibular eval and feel likely symptoms worsened by temporal swelling and pt and girlfriend report surgeon said with craniotomy had to move or ?cut the nerve for hearing (which would also potentially affect vestibular information.)  Will benefit from continued mobilization with compensation and will initiate VOR training for adaptation.  Will defer any positional testing till temporal fracture and edema much more resolved.  Follow Up Recommendations  Outpatient PT ([facility])     Equipment Recommendations  None recommended by PT    Recommendations for Other Services       Precautions / Restrictions Precautions Precautions: Fall Precaution Comments: temporal bone fx    Mobility  Bed Mobility Overal bed mobility: Modified Independent                Transfers Overall transfer level: Modified independent                  Ambulation/Gait Ambulation/Gait assistance: Min guard;Supervision Ambulation Distance (Feet): 400 Feet Assistive device: None Gait Pattern/deviations: Step-through pattern;Decreased stride length     General Gait Details: decreased trunk rotation, slow and effortful with cues for visual targets for compensation.   Stairs            Wheelchair Mobility    Modified Rankin (Stroke Patients Only)       Balance Overall balance assessment: Needs assistance           Standing balance-Leahy Scale: Good Standing balance comment: static balance is good, wobbly with ambulation no device, improved with visual compensation                    Cognition  Arousal/Alertness: Awake/alert Behavior During Therapy: WFL for tasks assessed/performed Overall Cognitive Status: Within Functional Limits for tasks assessed                      Exercises Other Exercises Other Exercises: intiated seated VOR with horizontal and vertical head movements and discussed doing for HEP, will issue handout later today.    General Comments General comments (skin integrity, edema, etc.): Education on inner ear system and possible pathology causing symptoms.  Patient does report spinning symptoms.  Increased HOB prior to mobility and no nystagmus seen.  Did get increased symptoms with seated vertical VOR and to lesser extent with horizontal VOR.  Oculomotor exam is WNL without evidence of gaze holding nystagmus.  Does exhibit decreased hearing in right ear and reports symptoms aggravated when lying and head turn right.  Discussed limitations to testing today due to temporal bone fracture and edema with recent hematoma.  Discussed follow up outpatient vestibular rehab and [facility].        Pertinent Vitals/Pain Pain Assessment: Faces Faces Pain Scale: Hurts whole lot Pain Location: head Pain Intervention(s): Repositioned (reports had pain meds already)    Home Living                      Prior Function            PT Goals (current goals can now be found in the care plan  section) Progress towards PT goals: Progressing toward goals    Frequency  Min 3X/week    PT Plan Current plan remains appropriate    Co-evaluation             End of Session Equipment Utilized During Treatment: Gait belt Activity Tolerance: Patient tolerated treatment well Patient left: in bed;with call bell/phone within reach     Time: 1140-1207 PT Time Calculation (min) (ACUTE ONLY): 27 min  Charges:  $Gait Training: 8-22 mins $Neuromuscular Re-education: 8-22 mins                    G Codes:      WYNN,CYNDI 03/07/2014, 12:43  PM Sheran Lawlessyndi Wynn, PT [phone] 03/07/2014

## 2014-03-07 NOTE — Progress Notes (Signed)
[facility] TEAM 1 - Stepdown/ICU TEAM Progress Note  Marcus CrumbleRobert W Stieg ZOX:[id number] DOB: 01/15/1986 DOA: 03/04/2014 PCP: Lilyan PuntLUKING,SCOTT, MD  Admit HPI / Brief Narrative: 28 y/o M Hx childhood asthma & inguinal hernia repair admitted after an apparent seizure.  Patient reported waking and felt normal on day of admission. He ate breakfast and then went to work as usual. He was using a back pack leaf blower when he felt hot and flushed with vision changes. The next thing he remembers is waking with EMS.Bystander reported 3-4 minutes of "full body shaking". No loss of bowel or bladder function. On arrival to ER, he was alert but somnolent and exam notable for blood in the R ear.   In the ED CT of head / neck was positive for acute temporal bone fx & epidural hematoma. He also had an episode of small volume vomiting in ER.    HPI/Subjective: Pt c/o unrelenting HA.  Denies cp, sob, f/c, n.v, or abdom pain.    Assessment/Plan:  Acute Right Temporal Fracture w/ Epidural Hematoma Collapsed while outside working at his job - believes struck his head on the curb - S/P Right temporal parietal craniotomy and evacuation of epidural hematoma - Neurosurgery following and has cleared for d/c home - to f/u w/ [facility] in 1 week for staple removal    Seizures Patient with no previous history of seizure - EEG normal - continue Keppra 500 mg BID per Neurology for 10 days then stop  Syncope and collapse -EEG and TTE nondiagnostic - UDS positive for benzodiazepine due to benzos given during tx of pt - pt remembers feeling very hot and lightheaded w/ visual blurriness prior to fall - [facility]   Code Status: FULL Family Communication: family present at time of exam Disposition Plan: plan for d/c home in AM is stable over night   Consultants: Dr. Billy Fischeravid Simonds (PCCM) Dr. Julio SicksHenry Pool (neurosurgery)  Procedure/Significant Events: 12/08 Admit after collapse at work. CT head positive  for epidural hematoma and temporal fracture 12/8 CT head without contrast;-previously identified RIGHT middle cranial fossa epidural hematoma increasing in size; 15 x 61 mm as compared with 8 x 39 mm previously-mass effect on RIGHT temporal lobe and gentle medial RIGHT uncal displacement. Minimal pneumocephalus-At the level of the septum pellucidum there is 2 mm right-to-left shift-Unchanged RIGHT temporal bone fracture extending to the mastoid.  12/8 EEG; normal awake and asleep EEG.  12/8;Right temporal parietal craniotomy and evacuation of epidural hematoma;Fracture site reconstructed using titanium plates and craniotomy flap  12/9 echocardiogram- Left ventricle:  Mild concentric hypertrophy. -LVEF= 65% to 70%.   Antibiotics: none  DVT prophylaxis: SCD  Objective: Blood pressure 122/74, pulse 71, temperature 98.1 F (36.7 C), temperature source Oral, resp. rate 12, height 5\' 10"  (1.778 m), weight 63.7 kg (140 lb 6.9 oz), SpO2 97 %.  Intake/Output Summary (Last 24 hours) at 03/07/14 1514 Last data filed at 03/07/14 1500  Gross per 24 hour  Intake 1287.5 ml  Output      0 ml  Net 1287.5 ml   Exam: General:  No acute respiratory distress Lungs: Clear to auscultation bilaterally without wheezes or crackles Cardiovascular: Regular rate and rhythm without murmur gallop or rub normal S1 and S2 Abdomen: Nontender, nondistended, soft, bowel sounds positive, no rebound, no ascites, no appreciable mass Extremities: No significant cyanosis, clubbing, or edema bilateral lower extremities  Data Reviewed: Basic Metabolic Panel:  Recent Labs Lab 03/04/14 1306 03/05/14 0205 03/06/14 0408  NA 139 141 136*  K 4.1 4.4 3.8  CL 101 102 96  CO2 21 23 25   GLUCOSE 105* 110* 98  BUN 13 11 8   CREATININE 0.92 0.88 0.71  CALCIUM 9.8 9.2 9.4  MG  --   --  1.8  PHOS  --   --  1.9*   CBC:  Recent Labs Lab 03/04/14 1306 03/05/14 0205 03/06/14 0408  WBC 5.4 9.3 6.9  HGB 12.7* 11.7* 12.1*    HCT 37.5* 36.0* 35.2*  MCV 85.0 87.0 87.3  PLT 190 196 169   CBG:  Recent Labs Lab 03/04/14 1446  GLUCAP 105*    Recent Results (from the past 240 hour(s))  MRSA PCR Screening     Status: None   Collection Time: 03/05/14 12:21 AM  Result Value Ref Range Status   MRSA by PCR NEGATIVE NEGATIVE Final    Comment:        The GeneXpert MRSA Assay (FDA approved for NASAL specimens only), is one component of a comprehensive MRSA colonization surveillance program. It is not intended to diagnose MRSA infection nor to guide or monitor treatment for MRSA infections.      Studies:  Recent x-ray studies have been reviewed in detail by the Attending Physician  Scheduled Meds:  Scheduled Meds: . levETIRAcetam  500 mg Intravenous Q12H    Time spent on care of this patient: 35 mins  Lonia BloodJeffrey T. McClung, MD [facility] For Consults/Admissions - Flow Manager - [phone] Office  [phone] Pager [phone]  On-Call/Text Page:      [email or web address]      password [email or web address]  03/07/2014, 3:14 PM   LOS: 3 days

## 2014-03-08 LAB — BASIC METABOLIC PANEL
Anion gap: 13 (ref 5–15)
BUN: 10 mg/dL (ref 6–23)
CHLORIDE: 103 meq/L (ref 96–112)
CO2: 26 mEq/L (ref 19–32)
Calcium: 9.3 mg/dL (ref 8.4–10.5)
Creatinine, Ser: 0.75 mg/dL (ref 0.50–1.35)
GFR calc non Af Amer: >90 mL/min (ref >90)
Glucose, Bld: 93 mg/dL (ref 70–99)
POTASSIUM: 3.8 meq/L (ref 3.7–5.3)
Sodium: 142 mEq/L (ref 137–147)

## 2014-03-08 MED ORDER — ACETAMINOPHEN 325 MG PO TABS: 650.0000 mg | ORAL_TABLET | Freq: Four times a day (QID) | ORAL | Status: DC | PRN

## 2014-03-08 MED ORDER — OXYCODONE HCL 5 MG PO TABS: 5.0000 mg | ORAL_TABLET | ORAL | Status: DC | PRN

## 2014-03-08 MED ORDER — LEVETIRACETAM 500 MG PO TABS: 500.0000 mg | ORAL_TABLET | Freq: Two times a day (BID) | ORAL | Status: DC

## 2014-03-08 MED ORDER — TRAMADOL HCL 50 MG PO TABS: 50.0000 mg | ORAL_TABLET | Freq: Four times a day (QID) | ORAL | Status: DC | PRN

## 2014-03-08 NOTE — Discharge Summary (Signed)
DISCHARGE SUMMARY  Marcus Mora  MR#: [id number]  DOB:10/27/1985  Date of Admission: 03/04/2014 Date of Discharge: 03/08/2014  Attending Physician:MCCLUNG,JEFFREY T  Patient's WUJ:WJXBJY,NWGNFPCP:LUKING,SCOTT, MD  Consults:  Neurosurgery - Dr. Jordan LikesPool  Disposition: D/C home   Follow-up Appts:     Follow-up Information    Follow up with POOL,HENRY A, MD. Schedule an appointment as soon as possible for a visit in 1 week.   Specialty:  Neurosurgery   Contact information:   [address] [place] [address] [phone]      Discharge Diagnoses: Acute Right Temporal Fracture w/ Epidural Hematoma Seizures Syncope and collapse  Initial presentation: 28 y/o M Hx childhood asthma & inguinal hernia repair admitted after an accidental fall v/s a syncopal spell resulting in signif closed head injury w/ observed post-trauma convulsions. Patient reported waking and felt normal on day of admission. He ate breakfast and then went to work as usual. He was using a back pack leaf blower when he felt hot and flushed with vision changes. The next thing he remembers is waking with EMS.Reportedly the pt struck his head on a concrete curb after passing out.  Bystander reported 3-4 minutes of "full body shaking". No loss of bowel or bladder function. On arrival to ER, he was alert but somnolent and exam notable for blood in the R ear.   In the ED CT of head / neck was positive for acute temporal bone fx & epidural hematoma. He also had an episode of small volume vomiting in ER.    Hospital Course:  Acute Right Temporal Verdia KubaFracure w/ Epidural Hematoma Collapsed while outside working at his job - believes struck his head on the curb - S/P Right temporal parietal craniotomy and evacuation of epidural hematoma - Neurosurgery following and has cleared for d/c home - to f/u w/ NS in 1 week for staple removal - instructed pt to not drive and that he is not yet cleared to return to work -  confirmed that pt does not live alone   Seizures Patient with no previous history of seizure - EEG normal - continue Keppra 500 mg BID per Neurology for 10 days then stop  Syncope and collapse EEG and TTE nondiagnostic - UDS positive for benzodiazepine due to benzos given during tx of pt - pt remembers feeling very hot and lightheaded w/ visual blurriness prior to fall - [facility] - no further w/o indicated at this time     Medication List    STOP taking these medications        ibuprofen 600 MG tablet  Commonly known as:  ADVIL,MOTRIN     ibuprofen 800 MG tablet  Commonly known as:  ADVIL,MOTRIN     naproxen 500 MG tablet  Commonly known as:  NAPROSYN     oxyCODONE-acetaminophen 5-325 MG per tablet  Commonly known as:  PERCOCET/ROXICET      TAKE these medications        acetaminophen 325 MG tablet  Commonly known as:  TYLENOL  Take 2 tablets (650 mg total) by mouth every 6 (six) hours as needed for mild pain.     cyclobenzaprine 10 MG tablet  Commonly known as:  FLEXERIL  Take 1 tablet (10 mg total) by mouth 3 (three) times daily as needed.     levETIRAcetam 500 MG tablet  Commonly known as:  KEPPRA  Take 1 tablet (500 mg total) by mouth 2 (two) times daily.  oxyCODONE 5 MG immediate release tablet  Commonly known as:  Oxy IR/ROXICODONE  Take 1-2 tablets (5-10 mg total) by mouth every 4 (four) hours as needed for severe pain.     penicillin v potassium 500 MG tablet  Commonly known as:  VEETID  Take 1 tablet (500 mg total) by mouth 4 (four) times daily.     traMADol 50 MG tablet  Commonly known as:  ULTRAM  Take 1 tablet (50 mg total) by mouth every 6 (six) hours as needed for moderate pain.       Day of Discharge BP 136/80 mmHg  Pulse 87  Temp(Src) 98 F (36.7 C) (Oral)  Resp 22  Ht 5\' 10"  (1.778 m)  Wt 63.7 kg (140 lb 6.9 oz)  BMI 20.15 kg/m2  SpO2 97%  Physical Exam: General: No acute respiratory distress Lungs: Clear to  auscultation bilaterally without wheezes or crackles Cardiovascular: Regular rate and rhythm without murmur gallop or rub normal S1 and S2 Abdomen: Nontender, nondistended, soft, bowel sounds positive, no rebound, no ascites, no appreciable mass Extremities: No significant cyanosis, clubbing, or edema bilateral lower extremities Neuro:  5/5 strength B U&LE - CN II-XII intact B - alert and oriented   Basic Metabolic Panel:  Recent Labs Lab 03/04/14 1306 03/05/14 0205 03/06/14 0408 03/08/14 0309  NA 139 141 136* 142  K 4.1 4.4 3.8 3.8  CL 101 102 96 103  CO2 21 23 25 26   GLUCOSE 105* 110* 98 93  BUN 13 11 8 10   CREATININE 0.92 0.88 0.71 0.75  CALCIUM 9.8 9.2 9.4 9.3  MG  --   --  1.8  --   PHOS  --   --  1.9*  --    CBC:  Recent Labs Lab 03/04/14 1306 03/05/14 0205 03/06/14 0408  WBC 5.4 9.3 6.9  HGB 12.7* 11.7* 12.1*  HCT 37.5* 36.0* 35.2*  MCV 85.0 87.0 87.3  PLT 190 196 169   CBG:  Recent Labs Lab 03/04/14 1446  GLUCAP 105*    Recent Results (from the past 240 hour(s))  MRSA PCR Screening     Status: None   Collection Time: 03/05/14 12:21 AM  Result Value Ref Range Status   MRSA by PCR NEGATIVE NEGATIVE Final    Comment:        The GeneXpert MRSA Assay (FDA approved for NASAL specimens only), is one component of a comprehensive MRSA colonization surveillance program. It is not intended to diagnose MRSA infection nor to guide or monitor treatment for MRSA infections.      Time spent in discharge (includes decision making & examination of pt): >30 minutes  03/08/2014, 12:42 PM   Lonia BloodJeffrey T. McClung, MD [facility] Office  [phone] Pager [phone]  On-Call/Text Page:      [email or web address]      password [email or web address]

## 2014-03-08 NOTE — Progress Notes (Signed)
Discharge instructions given to patient and fiance at bedside all questions answered at this time.  Prescriptions given to patient.  Pt. VSS with no s/s of distress noted.  Patient is stable at discharge.

## 2014-03-08 NOTE — Discharge Instructions (Signed)
Epidural Hemorrhage  Bleeding between the skull and membranes covering the brain is called an epidural hemorrhage. This is caused by injury to an artery along the skull, often due to a direct hit (blunt trauma). This condition may be confused with inflammation of the brain lining (meningitis). Epidural hemorrhage often leads to a collection of clotted blood (hematoma) that grows quickly. This increases pressure in the skull, causing symptoms and sometimes death.  SYMPTOMS  These symptoms develop within 1 to 96 hours after a head injury:   Headache that steadily gets worse. Drowsiness or unconsciousness. Nausea or vomiting.  Inability to move arms or legs.  Changes in the pupils: unequal in size, not responsive to light. CAUSES  A cut (laceration) of an artery (middle meningeal artery) along the skull. Often, related to skull fracture. RISK INCREASES WITH:  Use of blood thinners (anticoagulant drugs), including aspirin or warfarin, and anti-inflammatory medicines.  Bleeding disorders (hemophilia, aplastic anemia).  Sports where head injury is likely (boxing, football, rugby, hockey, auto racing, motorcycle riding, bicycle racing (road and mountain), horseback riding). PREVENTION   Avoid head injury.  Wear properly fitted and padded protective headgear. PROGNOSIS  Recovery depends on many factors. These include: general health, age, extent of the injury, speed of treatment, and extent of the bleeding or clot. After the clot is removed, brain tissue that has been under pressure often expands to fill its original space. Quick diagnosis and prompt surgery can often result in complete recovery.  RELATED COMPLICATIONS   Death, if pressure on the brain and bleeding last over 24 hours.  Permanent brain damage. This can include partial or complete paralysis, behavior and personality changes, and speech problems.  Convulsions. TREATMENT This is a medical emergency. It must be treated  immediately. Treatment consists of surgery, where a hole is drilled in the skull to drain the blood clot and relieve pressure on the brain. The damaged artery is repaired. If speech or muscle control has been damaged, physical, occupational, or speech therapy may be needed. Once you have this condition, do not play contact sports.  MEDICATION   Steroid medicines and diuretics may be used. This reduces swelling inside the skull.  Anti-convulsant medicine may be prescribed. This is to prevent seizures. SEEK MEDICAL CARE IF:   You develop any symptoms of epidural hematoma or hemorrhage. This is an emergency!  Any of the following occur during treatment:  Fever develops.  Surgery wound becomes red, swollen, or tender.  Headache gets worse.  Drowsiness increases. Unconsciousness develops.  Nausea or vomiting begin.  Confusion increases. Mental changes develop.  Seek medical advice for even a moderate blow to the head. Document Released: 03/14/2005 Document Revised: 06/06/2011 Document Reviewed: 06/26/2008 [facility] Patient Information 2015 [place]. This information is not intended to replace advice given to you by your health care provider. Make sure you discuss any questions you have with your health care provider.

## 2014-03-21 ENCOUNTER — Encounter (HOSPITAL_COMMUNITY): Payer: Self-pay | Admitting: Cardiology

## 2014-03-21 ENCOUNTER — Emergency Department (HOSPITAL_COMMUNITY)
Admission: EM | Admit: 2014-03-21 | Discharge: 2014-03-21 | Disposition: A | Discharge status: Home / Self Care | Payer: Self-pay | Attending: Emergency Medicine | Admitting: Emergency Medicine

## 2014-03-21 ENCOUNTER — Emergency Department (HOSPITAL_COMMUNITY): Payer: Self-pay

## 2014-03-21 DIAGNOSIS — F0781 Postconcussional syndrome: Secondary | ICD-10-CM | POA: Insufficient documentation

## 2014-03-21 DIAGNOSIS — Z8679 Personal history of other diseases of the circulatory system: Secondary | ICD-10-CM | POA: Insufficient documentation

## 2014-03-21 DIAGNOSIS — Z8781 Personal history of (healed) traumatic fracture: Secondary | ICD-10-CM | POA: Insufficient documentation

## 2014-03-21 DIAGNOSIS — Z79899 Other long term (current) drug therapy: Secondary | ICD-10-CM | POA: Insufficient documentation

## 2014-03-21 DIAGNOSIS — G44309 Post-traumatic headache, unspecified, not intractable: Secondary | ICD-10-CM | POA: Insufficient documentation

## 2014-03-21 DIAGNOSIS — R519 Headache, unspecified: Secondary | ICD-10-CM

## 2014-03-21 DIAGNOSIS — R51 Headache: Secondary | ICD-10-CM

## 2014-03-21 DIAGNOSIS — J45909 Unspecified asthma, uncomplicated: Secondary | ICD-10-CM | POA: Insufficient documentation

## 2014-03-21 HISTORY — DX: Headache: R51

## 2014-03-21 HISTORY — DX: Personal history of other medical treatment: Z92.89

## 2014-03-21 HISTORY — DX: Headache, unspecified: R51.9

## 2014-03-21 MED ORDER — OXYCODONE-ACETAMINOPHEN 5-325 MG PO TABS: ORAL_TABLET | ORAL | Status: DC

## 2014-03-21 MED ORDER — OXYCODONE-ACETAMINOPHEN 5-325 MG PO TABS
2.0000 | ORAL_TABLET | Freq: Once | ORAL | Status: AC
  Administered 2014-03-21: 2 via ORAL
  Filled 2014-03-21: qty 2

## 2014-03-21 MED ORDER — ONDANSETRON 8 MG PO TBDP
8.0000 mg | ORAL_TABLET | Freq: Once | ORAL | Status: AC
  Administered 2014-03-21: 8 mg via ORAL
  Filled 2014-03-21: qty 1

## 2014-03-21 MED ORDER — ONDANSETRON 4 MG PO TBDP: 4.0000 mg | ORAL_TABLET | Freq: Three times a day (TID) | ORAL | Status: DC | PRN

## 2014-03-21 NOTE — Discharge Instructions (Signed)
°Emergency Department Resource Guide °1) Find a Doctor and Pay Out of Pocket °Although you won't have to find out who is covered by your insurance plan, it is a good idea to ask around and get recommendations. You will then need to call the office and see if the doctor you have chosen will accept you as a new patient and what types of options they offer for patients who are self-pay. Some doctors offer discounts or will set up payment plans for their patients who do not have insurance, but you will need to ask so you aren't surprised when you get to your appointment. ° °2) Contact Your Local Health Department °Not all health departments have doctors that can see patients for sick visits, but many do, so it is worth a call to see if yours does. If you don't know where your local health department is, you can check in your phone book. The CDC also has a tool to help you locate your state's health department, and many state websites also have listings of all of their local health departments. ° °3) Find a Walk-in Clinic °If your illness is not likely to be very severe or complicated, you may want to try a walk in clinic. These are popping up all over the country in pharmacies, drugstores, and shopping centers. They're usually staffed by nurse practitioners or physician assistants that have been trained to treat common illnesses and complaints. They're usually fairly quick and inexpensive. However, if you have serious medical issues or chronic medical problems, these are probably not your best option. ° °No Primary Care Doctor: °- Call [facility] at  832-8000 - they can help you locate a primary care doctor that  accepts your insurance, provides certain services, etc. °- Physician Referral Service- 1-800-533-3463 ° °Chronic Pain Problems: °Organization         Address  Phone   Notes  °[facility]  (336) 297-2271 Patients need to be referred by their primary care doctor.  ° °Medication  Assistance: °Organization         Address  Phone   Notes  °[place] [facility] [address] °[place], [place] [address] (336) 641-8030 --Must be a resident of [place] °-- Must have NO insurance coverage whatsoever (no Medicaid/ Medicare, etc.) °-- The pt. MUST have a primary care doctor that directs their care regularly and follows them in the community °  °[facility]  (866) 331-1348   °[employer]  (888) 892-1162   ° °Agencies that provide inexpensive medical care: °Organization         Address  Phone   Notes  °[facility]  (336) 832-8035   °[facility]    (336) 832-7272   °[facility] [address] °[place], [place] [address] (336) 832-4777   °[facility] [place] [facility] [address], °[place] (336) 271-4999   °[facility]    (336) 373-0678   °[facility]    (336) 272-1050   °[facility] ° [address], [place] Phone:  (336) 832-4444, Fax:  (336) 832-4440 Hours of Operation:  9 am - 6 pm, M-F.  Also accepts Medicaid/Medicare and self-pay.  °[facility] ° [address], [place] Phone: (336) 832-3150, Fax: (336) [phone]. Hours of Operation:  8:30 am - 5:30 pm, M-F.  Also accepts Medicaid and self-pay.  °[facility] [place] [address], [place] Phone: (336) 878-6027   °[facility] [address], [place], [place] (336)723-1848, Ext. 123 Mondays & Thursdays: 7-9 AM.  First 15 patients are seen on a first come, first serve basis. °  ° °Medicaid-accepting [place] Providers: ° °Organization         Address  Phone   Notes  °[facility] [address], [place] (336) 641-2100 Also accepts self-pay patients.  °[facility] [address], [place] ° (336) 856-9996   °[facility] [address], [place]  (336) 288-8857   °[facility] [address] [place] [address], [place] (336) 299-7000   °Veita Bland [address], [place]  ° (336) 373-1557 Only accepts [place] Access Medicaid patients after they have their name applied to their card.  ° °Self-Pay (no insurance) in [place]: ° °Organization         Address  Phone   Notes  °Sickle Cell Patients, [facility] [address], [place] (336) 832-1970   °[facility] [address], [place] (336) 832-4400   °[facility] ° [address], [place] (336) 992-4800   °[facility]/Dr. Osei-Bonsu ° [address] [place] [address], [place] or [address], [place] (336) 841-8500 Phone number for both [place] and [place] locations is the same.  °[facility] [address], [place] (336) 299-0000   °[facility] [address] [place] [address], [place] or [address] (336) 852-7530 °(336) 878-2260   °[facility] [address], [place] (336) 350-1642, phone; (336) 294-5005, fax Sees patients 1st and 3rd Saturday of every month.  Must not qualify for public or private insurance (i.e. Medicaid, Medicare, [place] Health Choice, Veterans' Benefits) • Household income should be no more than 200% of the poverty level •The clinic cannot treat you if you are pregnant or think you are pregnant • Sexually transmitted diseases are not treated at the clinic.  ° ° °Dental Care: °Organization         Address  Phone  Notes  °[place] [facility] [address], [place] (336) 641-6152 Accepts children up to age 21 who are enrolled in Medicaid or [place] Health Choice; pregnant women with a Medicaid card; and children who have applied for Medicaid or [place] Health Choice, but were declined, whose parents can pay a reduced fee at time of service.  °[place]  [facility] [place]  [address], [place] (336) 641-7733 Accepts children up to age 21 who are enrolled in Medicaid or [place] Health Choice; pregnant women with a Medicaid card; and children who have applied for Medicaid or [place] Health Choice, but were declined, whose parents can pay a reduced fee at time of service.  °[facility] ° [address], [place] (336) 641-4533 Patients are seen by appointment only. Walk-ins are not accepted. [facility] will see patients 18 years of age and older. °Monday - Tuesday (8am-5pm) °Most Wednesdays (8:30-5pm) °$30 per visit, cash only  °[facility] ° [address], [place] (336) 641-4533 Patients are seen by appointment only. Walk-ins are not accepted. [facility] will see patients 18 years of age and older. °One   Wednesday Evening (Monthly: Volunteer Based).  $30 per visit, cash only  °[facility]  (919) 537-3737 for adults; Children under age 4, call Graduate Pediatric Dentistry at ([phone]) [phone]. Children aged 4-14, please call (919) [phone] to request a pediatric application. ° Dental services are provided in all areas of dental care including fillings, crowns and bridges, complete and partial dentures, implants, gum treatment, root canals, and extractions. Preventive care is also provided. Treatment is provided to both adults and children. °Patients are selected via a lottery and there is often a waiting list. °  °[facility] [address], °[place] ° (336) 763-8833 [email or web address] °  °[facility] [address], [place], [place] (336)723-1848, Ext. 123 Second and Fourth Thursday of each month, opens at 6:30 AM; Clinic ends at 9 AM.  Patients are seen on a first-come first-served basis, and a limited number are seen during each clinic.  ° °[facility] ° [address], [place], [place] (336) 723-7904    Eligibility Requirements °You must have lived in [place], [place], or [place] counties for at least the last three months. °  You cannot be eligible for state or federal sponsored healthcare insurance, including Veterans Administration, Medicaid, or Medicare. °  You generally cannot be eligible for healthcare insurance through your employer.  °  How to apply: °Eligibility screenings are held every Tuesday and Wednesday afternoon from 1:00 pm until 4:00 pm. You do not need an appointment for the interview!  °[facility] [address], [place], [place] 336-631-2330   °[facility]  336-342-8273   °[facility]  336-703-3100   °[facility]  336-570-6415   ° °Behavioral Health Resources in the Community: °Intensive Outpatient Programs °Organization         Address  Phone  Notes  °[facility] [address], [place], [place] 336-878-6098   °[facility] [address], [place], [place] 336-832-9800   °[facility] [address], [place], [place] ° 336-882-2125   °[facility] [address],  °[place], [place] 1-800-853-5163 or 336-641-4981   °Substance Abuse Resources °Organization         Address  Phone  Notes  °[facility]  336-882-2125   °[facility]  336-784-9470   °[facility]  336-285-9073   °[facility]  336-845-3988   °Residential & Outpatient Substance Abuse Program  1-800-659-3381   °Psychological Services °Organization         Address  Phone  Notes  °[facility]  336- 832-9600   °[facility]  336- 378-7881   °[facility] [address], [place] 1-800-853-5163 or 336-641-4981   ° °Mobile Crisis Teams °Organization         Address  Phone  Notes  °[facility], Mobile Crisis Care Unit  1-877-626-1772   °Assertive °[facility] ° [address]  [place], [place] 336-834-9664   °Sharon DeEsch [address] °[place] 336-554-5454   ° °Self-Help/Support Groups °Organization         Address  Phone             Notes  °[facility]  - variety of support groups  336- 373-1402 Call for more information  °Narcotics Anonymous (NA), [facility] [address], °[place]  2 meetings at this location  ° °  Residential Treatment Programs Organization         Address  Phone  Notes  [facility] [address],    [place]  1-610-960-45401-(202)800-4878   [facility]  [address], [place] [phone], [place], [place] [phone](978)[phone]   [facility] [address], [place] [phone] Admissions: 8am-3pm M-F  [facility] [address]    [place], [place] [phone]   [facility] [address], [place], [place] [phone]   [facility] [address]  [place], [place] [phone]   [facility] Intensive Outpatient [address], [place], [place] [phone]   [facility] ([facility] [address]  [place], [place] [phone]-(667)111-9934 or 910-428-3076(985)002-9288   [facility] ([facility] [address] [place], [place] [phone] Accepts Medicaid  [facility] [address]  [place] [phone] Substance Abuse/Addiction Treatment   [facility]         Address  Phone  Notes  [facility]  (203)373-5656(888) 276-086-1425   Angie FavaJulie Brannon, PhD [address], [address] [place], [place]   [phone](336) (941) [phone] or [phone](336) (220)[phone]   [facility]   [address] [place], [place] (801) [phone](336) [phone]   [facility] [address], [place], [place] [phone](336) [phone] Insurance/Medicaid/sponsorship through [facility] [address]                                    [place], [place] [phone](336) [phone] Therapy/tele-psych/case    [facility] [address]   [place], [place] [phone](336) [phone]    Dr. Lolly MustacheArfeen  [phone](336) [phone]   [facility]  [employer] [facility] 1) [address], [place] 2) [address], [place] 3)  [address], [place] [phone](336) [phone](336) (212) 012-9752  (204) [phone](336) [phone]   [facility] Child Abuse Hotline 9567906567(336) (808)695-5780 or 770-804-1398(336) 2347479272 (After Hours)      Take the prescription as directed.  Call your regular medical doctor and your Neurosurgeon on Monday to schedule a follow up appointment next week.  Return to the Emergency Department immediately sooner if worsening.

## 2014-03-21 NOTE — ED Provider Notes (Signed)
CSN: [id number]     Arrival date & time 03/21/14  [id number] History   First MD Initiated Contact with Patient 03/21/14 [phone]     Chief Complaint  Patient presents with  . Headache  . Medication Refill      HPI Pt was seen at 0920.  Per pt, c/o gradual onset and persistence of constant headache since s/p head injury on 03/04/2014.  Describes the headache as unchanged since his hospitalization and subsequent discharge on 03/08/2014. States he has run out of the oxycodone he has been taking for the pain. States he woke up at 0200 with 2 episodes of N/V. Denies headache was sudden or maximal in onset or at any time.  Denies new injury, no visual changes, no focal motor weakness, no tingling/numbness in extremities, no fevers, no neck pain, no rash.      Past Medical History  Diagnosis Date  . Asthma   . Temporal bone fracture 03/04/14    Right  . Epidural hematoma 03/04/14  . History of electroencephalogram 02/2014    normal  . Headache     constant since head injury 03/04/2014   Past Surgical History  Procedure Laterality Date  . Hernia repair    . Hernia    . Craniotomy N/A 03/04/2014    Procedure: CRANIOTOMY HEMATOMA EVACUATION EPIDURAL;  Surgeon: Temple PaciniHenry A Pool, MD;  Location: [facility];  Service: Neurosurgery;  Laterality: N/A;   Family History  Problem Relation Age of Onset  . Thyroid disease Mother    History  Substance Use Topics  . Smoking status: Never Smoker   . Smokeless tobacco: Not on file  . Alcohol Use: No     Comment: denies use 03/12/12    Review of Systems ROS: Statement: All systems negative except as marked or noted in the HPI; Constitutional: Negative for fever and chills. ; ; Eyes: Negative for eye pain, redness and discharge. ; ; ENMT: Negative for ear pain, hoarseness, nasal congestion, sinus pressure and sore throat. ; ; Cardiovascular: Negative for chest pain, palpitations, diaphoresis, dyspnea and peripheral edema. ; ; Respiratory: Negative for cough,  wheezing and stridor. ; ; Gastrointestinal: +N/V. Negative for diarrhea, abdominal pain, blood in stool, hematemesis, jaundice and rectal bleeding. . ; ; Genitourinary: Negative for dysuria, flank pain and hematuria. ; ; Musculoskeletal: Negative for back pain and neck pain. Negative for swelling and trauma.; ; Skin: Negative for pruritus, rash, abrasions, blisters, bruising and skin lesion.; ; Neuro: +headache. Negative for lightheadedness and neck stiffness. Negative for weakness, altered level of consciousness , altered mental status, extremity weakness, paresthesias, involuntary movement, seizure and syncope.      Allergies  Hydrocodone  Home Medications   Prior to Admission medications   Medication Sig Start Date End Date Taking? Authorizing Provider  acetaminophen (TYLENOL) 325 MG tablet Take 2 tablets (650 mg total) by mouth every 6 (six) hours as needed for mild pain. 03/08/14   Lonia BloodJeffrey T McClung, MD  cyclobenzaprine (FLEXERIL) 10 MG tablet Take 1 tablet (10 mg total) by mouth 3 (three) times daily as needed. Patient not taking: Reported on 03/04/2014 06/13/13   Tammy L. Triplett, PA-C  levETIRAcetam (KEPPRA) 500 MG tablet Take 1 tablet (500 mg total) by mouth 2 (two) times daily. 03/08/14   Lonia BloodJeffrey T McClung, MD  oxyCODONE (OXY IR/ROXICODONE) 5 MG immediate release tablet Take 1-2 tablets (5-10 mg total) by mouth every 4 (four) hours as needed for severe pain. 03/08/14   Lonia BloodJeffrey T McClung, MD  penicillin v potassium (VEETID) 500 MG tablet Take 1 tablet (500 mg total) by mouth 4 (four) times daily. Patient not taking: Reported on 03/04/2014 03/06/13   Lyanne CoKevin M Campos, MD  traMADol (ULTRAM) 50 MG tablet Take 1 tablet (50 mg total) by mouth every 6 (six) hours as needed for moderate pain. 03/08/14   Lonia BloodJeffrey T McClung, MD   BP 139/100 mmHg  Pulse 97  Temp(Src) 98.2 F (36.8 C) (Oral)  Resp 18  Ht 5\' 10"  (1.778 m)  Wt 140 lb (63.504 kg)  BMI 20.09 kg/m2  SpO2 99% Physical Exam   0925: Physical examination:  Nursing notes reviewed; Vital signs and O2 SAT reviewed;  Constitutional: Well developed, Well nourished, Well hydrated, In no acute distress; Head:  Normocephalic, atraumatic. +healing surgical wound right temporal scalp without edema, ecchymosis, erythema, open wounds, or drainage.; Eyes: EOMI, PERRL, No scleral icterus; ENMT: TM's clear bilat. Mouth and pharynx normal, Mucous membranes moist; Neck: Supple, Full range of motion, No lymphadenopathy; Cardiovascular: Regular rate and rhythm, No murmur, rub, or gallop; Respiratory: Breath sounds clear & equal bilaterally, No rales, rhonchi, wheezes.  Speaking full sentences with ease, Normal respiratory effort/excursion; Chest: Nontender, Movement normal; Abdomen: Soft, Nontender, Nondistended, Normal bowel sounds; Genitourinary: No CVA tenderness; Extremities: Pulses normal, No tenderness, No edema, No calf edema or asymmetry.; Neuro: AA&Ox3, Major CN grossly intact. No facial droop. Speech clear. No gross focal motor or sensory deficits in extremities. Climbs on and off stretcher easily by himself. Gait steady.; Skin: Color normal, Warm, Dry.   ED Course  Procedures     MDM  MDM Reviewed: previous chart, nursing note and vitals Reviewed previous: CT scan Interpretation: CT scan   Ct Head Wo Contrast 03/21/2014   CLINICAL DATA:  Right height headache, fall with epidural hematoma 03/04/2014.  EXAM: CT HEAD WITHOUT CONTRAST  TECHNIQUE: Contiguous axial images were obtained from the base of the skull through the vertex without intravenous contrast.  COMPARISON:  03/04/2014  FINDINGS: The patient is status post craniotomy in right temporal parietal region. The previous right middle cranial fossa epidural hematoma has resolved only minimal residual dural thickening is noted in axial image 12. There is no hemorrhage or mass effect. No midline shift. No hydrocephalus. No mass lesion is noted on this unenhanced CT no  intraventricular hemorrhage. The gray and white-matter differentiation is preserved. Partial opacification of right mastoid air cells increased from prior exam.  IMPRESSION: The patient is status post right temporoparietal craniotomy. Previous right middle cranial fossa epidural hematoma has resolved. Only minimal residual right parietal dural thickening noted axial image 12. There is no mass effect or midline shift. No intracranial hemorrhage. No hydrocephalus. Partial opacification of right mastoid air cells increased from prior exam.   Electronically Signed   By: Natasha MeadLiviu  Pop M.D.   On: 03/21/2014 09:50    1025:  Pt feels "better" after meds and wants to go home now. Has tol PO well while in the ED without N/V. Pt is talking on the telephone on my reassessment; NAD, resps easy, neuro exam intact and unchanged.  [facility] Dr[facility] Gerlene FeeKritzer, case discussed, including:  HPI, pertinent PM/SHx, VS/PE, dx testing, ED course and treatment:  States he has viewed the CT images which are improved/normal post-op, states pt will have post-concussive symptoms for the next several weeks, requests to reiterate post-concussive syndrome to pt and family, d/c and f/u as outpt. Dx and testing, as well d/w Neurosurgeon, d/w pt and family.  Questions  answered.  Verb understanding, agreeable to d/c home with outpt f/u.     Samuel Jester, DO 03/24/14 1114
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
## 2014-05-20 ENCOUNTER — Emergency Department (HOSPITAL_COMMUNITY)
Admission: EM | Admit: 2014-05-20 | Discharge: 2014-05-20 | Disposition: A | Discharge status: Home / Self Care | Payer: Self-pay | Attending: Emergency Medicine | Admitting: Emergency Medicine

## 2014-05-20 ENCOUNTER — Encounter (HOSPITAL_COMMUNITY): Payer: Self-pay | Admitting: *Deleted

## 2014-05-20 DIAGNOSIS — R51 Headache: Secondary | ICD-10-CM | POA: Insufficient documentation

## 2014-05-20 DIAGNOSIS — G8929 Other chronic pain: Secondary | ICD-10-CM

## 2014-05-20 DIAGNOSIS — Z79899 Other long term (current) drug therapy: Secondary | ICD-10-CM | POA: Insufficient documentation

## 2014-05-20 DIAGNOSIS — Z8679 Personal history of other diseases of the circulatory system: Secondary | ICD-10-CM | POA: Insufficient documentation

## 2014-05-20 DIAGNOSIS — J45909 Unspecified asthma, uncomplicated: Secondary | ICD-10-CM | POA: Insufficient documentation

## 2014-05-20 DIAGNOSIS — Z8781 Personal history of (healed) traumatic fracture: Secondary | ICD-10-CM | POA: Insufficient documentation

## 2014-05-20 MED ORDER — PROCHLORPERAZINE EDISYLATE 5 MG/ML IJ SOLN
10.0000 mg | Freq: Once | INTRAMUSCULAR | Status: AC
  Administered 2014-05-20: 10 mg via INTRAVENOUS
  Filled 2014-05-20: qty 2

## 2014-05-20 MED ORDER — SODIUM CHLORIDE 0.9 % IV SOLN
INTRAVENOUS | Status: DC
  Administered 2014-05-20: 20:00:00 via INTRAVENOUS

## 2014-05-20 MED ORDER — BUTALBITAL-APAP-CAFFEINE 50-325-40 MG PO TABS: 1.0000 | ORAL_TABLET | Freq: Four times a day (QID) | ORAL | Status: AC | PRN

## 2014-05-20 MED ORDER — HYDROMORPHONE HCL 1 MG/ML IJ SOLN
1.0000 mg | Freq: Once | INTRAMUSCULAR | Status: AC
Start: 2014-05-20 — End: 2014-05-20
  Administered 2014-05-20: 1 mg via INTRAVENOUS
  Filled 2014-05-20: qty 1

## 2014-05-20 NOTE — ED Provider Notes (Signed)
CSN: [id number]     Arrival date & time 05/20/14  1733 History  This chart was scribed for Linwood DibblesJon Shakeena Kafer, MD by Bronson CurbJacqueline Melvin, ED Scribe. This patient was seen in room APA08/APA08 and the patient's care was started at 7:40 PM.    Chief Complaint  Patient presents with  . Headache    The history is provided by the patient. No language interpreter was used.     HPI Comments: Marcus Mora is a 29 y.o. male who presents to the Emergency Department complaining of constant HA to right side of head that began in December 2015 s/p craniotomy. Pt reports that it feels like "nails" on his head. PT complains of vomiting twice today and dizziness upon standing. He claims that the headache is exacerbated with light exposure. He was seen by his neurosurgeon twice since surgery. Pt had CT scan after the surgery and no bleeding was seen. He was referred to pain management doctor by neurosurgeon but has not gone yet. He was prescribed 5 mg percocet by neurosurgeon but has been out for some time. Pt denies any other symptoms.   Past Medical History  Diagnosis Date  . Asthma   . Temporal bone fracture 03/04/14    Right  . Epidural hematoma 03/04/14  . History of electroencephalogram 02/2014    normal  . Headache     constant since head injury 03/04/2014   Past Surgical History  Procedure Laterality Date  . Hernia repair    . Hernia    . Craniotomy N/A 03/04/2014    Procedure: CRANIOTOMY HEMATOMA EVACUATION EPIDURAL;  Surgeon: Temple PaciniHenry A Pool, MD;  Location: [facility];  Service: Neurosurgery;  Laterality: N/A;   Family History  Problem Relation Age of Onset  . Thyroid disease Mother    History  Substance Use Topics  . Smoking status: Never Smoker   . Smokeless tobacco: Not on file  . Alcohol Use: No     Comment: denies use 03/12/12    Review of Systems  A complete 10 system review of systems was obtained and all systems are negative except as noted in the HPI and PMH.    Allergies   Hydrocodone  Home Medications   Prior to Admission medications   Medication Sig Start Date End Date Taking? Authorizing Provider  acetaminophen (TYLENOL) 325 MG tablet Take 2 tablets (650 mg total) by mouth every 6 (six) hours as needed for mild pain. 03/08/14   Lonia BloodJeffrey T McClung, MD  butalbital-acetaminophen-caffeine [place](FIORICET) [phone] MG per tablet Take 1-2 tablets by mouth every 6 (six) hours as needed for headache. 05/20/14 05/20/15  Linwood DibblesJon Jaaron Oleson, MD  cyclobenzaprine (FLEXERIL) 10 MG tablet Take 1 tablet (10 mg total) by mouth 3 (three) times daily as needed. Patient not taking: Reported on 03/04/2014 06/13/13   Tammy L. Triplett, PA-C  levETIRAcetam (KEPPRA) 500 MG tablet Take 1 tablet (500 mg total) by mouth 2 (two) times daily. 03/08/14   Lonia BloodJeffrey T McClung, MD  ondansetron (ZOFRAN ODT) 4 MG disintegrating tablet Take 1 tablet (4 mg total) by mouth every 8 (eight) hours as needed for nausea or vomiting. 03/21/14   Samuel JesterKathleen McManus, DO  oxyCODONE (OXY IR/ROXICODONE) 5 MG immediate release tablet Take 1-2 tablets (5-10 mg total) by mouth every 4 (four) hours as needed for severe pain. 03/08/14   Lonia BloodJeffrey T McClung, MD  oxyCODONE-acetaminophen (PERCOCET/ROXICET) 5-325 MG per tablet 1 or 2 tabs PO q6h prn pain 03/21/14   Samuel JesterKathleen McManus, DO  penicillin v  potassium (VEETID) 500 MG tablet Take 1 tablet (500 mg total) by mouth 4 (four) times daily. Patient not taking: Reported on 03/04/2014 03/06/13   Lyanne Co, MD  traMADol (ULTRAM) 50 MG tablet Take 1 tablet (50 mg total) by mouth every 6 (six) hours as needed for moderate pain. 03/08/14   Lonia Blood, MD   Triage vitals: BP 158/104 mmHg  Pulse 93  Temp(Src) 98 F (36.7 C) (Oral)  Resp 14  Ht  (1.803 m)  Wt 155 lb (70.308 kg)  BMI 21.63 kg/m2  SpO2 100%  Physical Exam  Constitutional: He appears well-developed and well-nourished. No distress.  HENT:  Head: Normocephalic and atraumatic.  Right Ear: External ear normal.   Left Ear: External ear normal.  No palpable abnormalities. Normal TM bilaterally.   Eyes: Conjunctivae are normal. Right eye exhibits no discharge. Left eye exhibits no discharge. No scleral icterus.  Neck: Normal range of motion. Neck supple. No tracheal deviation present.  Cardiovascular: Normal rate, regular rhythm and intact distal pulses.   Pulmonary/Chest: Effort normal and breath sounds normal. No stridor. No respiratory distress. He has no wheezes. He has no rales.  Abdominal: Soft. Bowel sounds are normal. He exhibits no distension. There is no tenderness. There is no rebound and no guarding.  Musculoskeletal: He exhibits no edema or tenderness.  Neurological: He is alert. He has normal strength. No cranial nerve deficit (no facial droop, extraocular movements intact, no slurred speech) or sensory deficit. He exhibits normal muscle tone. He displays no seizure activity. Coordination normal.  Skin: Skin is warm and dry. No rash noted.  Psychiatric: He has a normal mood and affect.  Nursing note and vitals reviewed.    ED Course  Procedures (including critical care time)  DIAGNOSTIC STUDIES: Oxygen Saturation is 100% on RA, normal by my interpretation.    COORDINATION OF CARE:   7:46 PM-Discussed treatment plan with pt at bedside and pt agreed to plan.  Medications  0.9 %  sodium chloride infusion ( Intravenous New Bag/Given 05/20/14 2025)  HYDROmorphone (DILAUDID) injection 1 mg (1 mg Intravenous Given 05/20/14 2034)  prochlorperazine (COMPAZINE) injection 10 mg (10 mg Intravenous Given 05/20/14 2034)     MDM   Final diagnoses:  Chronic nonintractable headache, unspecified headache type   Pt has been having chronic headaches.  He has had subsequent CT scans since his surgery that did not show recurrent epidural.  Treated with migraine cocktail.  Sx improved.  Pt has a referral to a pain management center for his headaches. I personally performed the services described in  this documentation, which was scribed in my presence.  The recorded information has been reviewed and is accurate.      Linwood Dibbles, MD 05/20/14 2157

## 2014-05-20 NOTE — Discharge Instructions (Signed)

## 2014-05-20 NOTE — ED Notes (Signed)
Pain rt side of head, since craniotomy in Dec 2015  Nausea, vomiting.  Pt has been referred to pain specialist.

## 2014-05-20 NOTE — ED Notes (Signed)
MD at bedside. 

## 2014-05-29 ENCOUNTER — Emergency Department (HOSPITAL_COMMUNITY): Payer: Self-pay

## 2014-05-29 ENCOUNTER — Emergency Department (HOSPITAL_COMMUNITY)
Admission: EM | Admit: 2014-05-29 | Discharge: 2014-05-29 | Disposition: A | Discharge status: Home / Self Care | Payer: Self-pay | Attending: Emergency Medicine | Admitting: Emergency Medicine

## 2014-05-29 ENCOUNTER — Encounter (HOSPITAL_COMMUNITY): Payer: Self-pay | Admitting: *Deleted

## 2014-05-29 DIAGNOSIS — Z79899 Other long term (current) drug therapy: Secondary | ICD-10-CM | POA: Insufficient documentation

## 2014-05-29 DIAGNOSIS — Y9389 Activity, other specified: Secondary | ICD-10-CM | POA: Insufficient documentation

## 2014-05-29 DIAGNOSIS — S0990XA Unspecified injury of head, initial encounter: Secondary | ICD-10-CM

## 2014-05-29 DIAGNOSIS — Z8781 Personal history of (healed) traumatic fracture: Secondary | ICD-10-CM | POA: Insufficient documentation

## 2014-05-29 DIAGNOSIS — Y92410 Unspecified street and highway as the place of occurrence of the external cause: Secondary | ICD-10-CM | POA: Insufficient documentation

## 2014-05-29 DIAGNOSIS — Z791 Long term (current) use of non-steroidal anti-inflammatories (NSAID): Secondary | ICD-10-CM | POA: Insufficient documentation

## 2014-05-29 DIAGNOSIS — S0083XA Contusion of other part of head, initial encounter: Secondary | ICD-10-CM | POA: Insufficient documentation

## 2014-05-29 DIAGNOSIS — W19XXXA Unspecified fall, initial encounter: Secondary | ICD-10-CM

## 2014-05-29 DIAGNOSIS — Y998 Other external cause status: Secondary | ICD-10-CM | POA: Insufficient documentation

## 2014-05-29 DIAGNOSIS — J45909 Unspecified asthma, uncomplicated: Secondary | ICD-10-CM | POA: Insufficient documentation

## 2014-05-29 DIAGNOSIS — W01198A Fall on same level from slipping, tripping and stumbling with subsequent striking against other object, initial encounter: Secondary | ICD-10-CM | POA: Insufficient documentation

## 2014-05-29 MED ORDER — NAPROXEN 500 MG PO TABS: 500.0000 mg | ORAL_TABLET | Freq: Two times a day (BID) | ORAL | Status: DC

## 2014-05-29 NOTE — ED Provider Notes (Addendum)
CSN: [id number]     Arrival date & time 05/29/14  1859 History   First MD Initiated Contact with Patient 05/29/14 1918     Chief Complaint  Patient presents with  . Fall     (Consider location/radiation/quality/duration/timing/severity/associated sxs/prior Treatment) Patient is a 29 y.o. male presenting with fall. The history is provided by the patient.  Fall Associated symptoms include headaches. Pertinent negatives include no chest pain, no abdominal pain and no shortness of breath.   patient status post fall tripped over shoestring struck left cheek on railing and then struck the back of his head on the right side. Patient stating dizzy after fall no loss of consciousness. No nausea no vomiting. No neck pain no other complaints. Nursing reported that he had low back pain that was denied to me.  Past Medical History  Diagnosis Date  . Asthma   . Temporal bone fracture 03/04/14    Right  . Epidural hematoma 03/04/14  . History of electroencephalogram 02/2014    normal  . Headache     constant since head injury 03/04/2014   Past Surgical History  Procedure Laterality Date  . Hernia repair    . Hernia    . Craniotomy N/A 03/04/2014    Procedure: CRANIOTOMY HEMATOMA EVACUATION EPIDURAL;  Surgeon: Temple PaciniHenry A Pool, MD;  Location: [facility];  Service: Neurosurgery;  Laterality: N/A;   Family History  Problem Relation Age of Onset  . Thyroid disease Mother    History  Substance Use Topics  . Smoking status: Never Smoker   . Smokeless tobacco: Not on file  . Alcohol Use: No     Comment: denies use 03/12/12    Review of Systems  Constitutional: Negative for fever.  HENT: Negative for congestion.   Eyes: Negative for visual disturbance.  Respiratory: Negative for shortness of breath.   Cardiovascular: Negative for chest pain.  Gastrointestinal: Negative for nausea, vomiting and abdominal pain.  Musculoskeletal: Negative for back pain and neck pain.  Skin: Negative for rash.   Neurological: Positive for dizziness and headaches.  Hematological: Does not bruise/bleed easily.  Psychiatric/Behavioral: Negative for confusion.      Allergies  Hydrocodone  Home Medications   Prior to Admission medications   Medication Sig Start Date End Date Taking? Authorizing Provider  acetaminophen (TYLENOL) 325 MG tablet Take 2 tablets (650 mg total) by mouth every 6 (six) hours as needed for mild pain. 03/08/14   Lonia BloodJeffrey T McClung, MD  butalbital-acetaminophen-caffeine (FIORICET) [phone] MG per tablet Take 1-2 tablets by mouth every 6 (six) hours as needed for headache. Patient not taking: Reported on 05/29/2014 05/20/14 05/20/15  Linwood DibblesJon Knapp, MD  cyclobenzaprine (FLEXERIL) 10 MG tablet Take 1 tablet (10 mg total) by mouth 3 (three) times daily as needed. Patient not taking: Reported on 03/04/2014 06/13/13   Tammy L. Triplett, PA-C  levETIRAcetam (KEPPRA) 500 MG tablet Take 1 tablet (500 mg total) by mouth 2 (two) times daily. Patient not taking: Reported on 05/29/2014 03/08/14   Lonia BloodJeffrey T McClung, MD  naproxen (NAPROSYN) 500 MG tablet Take 1 tablet (500 mg total) by mouth 2 (two) times daily. 05/29/14   Vanetta MuldersScott Demitrus Francisco, MD  ondansetron (ZOFRAN ODT) 4 MG disintegrating tablet Take 1 tablet (4 mg total) by mouth every 8 (eight) hours as needed for nausea or vomiting. Patient not taking: Reported on 05/29/2014 03/21/14   Samuel JesterKathleen McManus, DO  oxyCODONE (OXY IR/ROXICODONE) 5 MG immediate release tablet Take 1-2 tablets (5-10 mg total) by mouth every  4 (four) hours as needed for severe pain. Patient not taking: Reported on 05/29/2014 03/08/14   Lonia Blood, MD  oxyCODONE-acetaminophen (PERCOCET/ROXICET) 5-325 MG per tablet 1 or 2 tabs PO q6h prn pain Patient not taking: Reported on 05/29/2014 03/21/14   Samuel Jester, DO  penicillin v potassium (VEETID) 500 MG tablet Take 1 tablet (500 mg total) by mouth 4 (four) times daily. Patient not taking: Reported on 03/04/2014 03/06/13   Lyanne Co, MD  traMADol (ULTRAM) 50 MG tablet Take 1 tablet (50 mg total) by mouth every 6 (six) hours as needed for moderate pain. Patient not taking: Reported on 05/29/2014 03/08/14   Lonia Blood, MD   BP 130/83 mmHg  Pulse 89  Temp(Src) 98.5 F (36.9 C) (Oral)  Resp 20  Ht  (1.803 m)  Wt 149 lb (67.586 kg)  BMI 20.79 kg/m2  SpO2 99% Physical Exam  Constitutional: He is oriented to person, place, and time. He appears well-developed and well-nourished. No distress.  HENT:  Head: Normocephalic.  Mouth/Throat: Oropharynx is clear and moist.  Skull without any acute trauma. Swelling to the left cheek area measuring about 2 x 3 cm.  Eyes: Conjunctivae and EOM are normal. Pupils are equal, round, and reactive to light.  Neck: Normal range of motion.  Cardiovascular: Normal rate, regular rhythm and normal heart sounds.   No murmur heard. Pulmonary/Chest: Effort normal and breath sounds normal. No respiratory distress.  Abdominal: Soft. Bowel sounds are normal. There is no tenderness.  Musculoskeletal: Normal range of motion.  Neurological: He is alert and oriented to person, place, and time. No cranial nerve deficit. He exhibits normal muscle tone. Coordination normal.  Patient's speech is slow but very appropriate.  Skin: Skin is warm. No rash noted.  Nursing note and vitals reviewed.   ED Course  Procedures (including critical care time) Labs Review Labs Reviewed - No data to display  Imaging Review Ct Head Wo Contrast  05/29/2014   CLINICAL DATA:  Initial evaluation for acute traumatic injury.  EXAM: CT HEAD WITHOUT CONTRAST  CT MAXILLOFACIAL WITHOUT CONTRAST  TECHNIQUE: Multidetector CT imaging of the head and maxillofacial structures were performed using the standard protocol without intravenous contrast. Multiplanar CT image reconstructions of the maxillofacial structures were also generated.  COMPARISON:  Prior study from 03/21/2014  FINDINGS: CT HEAD FINDINGS  No acute  intracranial hemorrhage or large vessel territory infarct. No mass lesion or midline shift. No hydrocephalus. No extra-axial fluid collection.  Postoperative changes from prior right frontotemporal craniotomy again seen. No skull fracture. Scalp soft tissues within normal limits.  No mastoid effusion.  CT MAXILLOFACIAL FINDINGS  Globes are intact. No retro-orbital hematoma or other process. Bony orbits are intact. No orbital floor fracture.  Zygomatic arches are intact.  Maxilla intact. Pterygoid plates intact. No nasal bone fracture. Nasal septum intact.  Mandible intact. Mandibular condyles normally situated within the temporomandibular fossa.  Scattered opacity present within the ethmoidal air cells bilaterally, greater on the left. Mild mucoperiosteal thickening present within the maxillary sinuses bilaterally. No air-fluid levels present within the paranasal sinuses.  Left facial contusion present.  IMPRESSION: CT HEAD:  1. No acute intracranial process. 2. Remote right frontotemporal craniotomy.  CT MAXILLOFACIAL:  1. No acute maxillofacial fracture. 2. Left facial contusion.   Electronically Signed   By: Rise Mu M.D.   On: 05/29/2014 21:14   Ct Maxillofacial Wo Cm  05/29/2014   CLINICAL DATA:  Initial evaluation for acute traumatic  injury.  EXAM: CT HEAD WITHOUT CONTRAST  CT MAXILLOFACIAL WITHOUT CONTRAST  TECHNIQUE: Multidetector CT imaging of the head and maxillofacial structures were performed using the standard protocol without intravenous contrast. Multiplanar CT image reconstructions of the maxillofacial structures were also generated.  COMPARISON:  Prior study from 03/21/2014  FINDINGS: CT HEAD FINDINGS  No acute intracranial hemorrhage or large vessel territory infarct. No mass lesion or midline shift. No hydrocephalus. No extra-axial fluid collection.  Postoperative changes from prior right frontotemporal craniotomy again seen. No skull fracture. Scalp soft tissues within normal  limits.  No mastoid effusion.  CT MAXILLOFACIAL FINDINGS  Globes are intact. No retro-orbital hematoma or other process. Bony orbits are intact. No orbital floor fracture.  Zygomatic arches are intact.  Maxilla intact. Pterygoid plates intact. No nasal bone fracture. Nasal septum intact.  Mandible intact. Mandibular condyles normally situated within the temporomandibular fossa.  Scattered opacity present within the ethmoidal air cells bilaterally, greater on the left. Mild mucoperiosteal thickening present within the maxillary sinuses bilaterally. No air-fluid levels present within the paranasal sinuses.  Left facial contusion present.  IMPRESSION: CT HEAD:  1. No acute intracranial process. 2. Remote right frontotemporal craniotomy.  CT MAXILLOFACIAL:  1. No acute maxillofacial fracture. 2. Left facial contusion.   Electronically Signed   By: Rise Mu M.D.   On: 05/29/2014 21:14     EKG Interpretation None      MDM   Final diagnoses:  Head injury  Fall, initial encounter  Contusion of face, initial encounter    Patient states tripped over a loose shoe string struck his left cheek. Instruct the back of his head. Patient's had a previous craniotomy on the right side. Patient states he took the one of his father's Percocets at 5 PM.  CT of head and face ordered.  Scans without any acute injuries. Patient stable for discharge home.    Vanetta Mulders, MD 05/29/14 1948  Vanetta Mulders, MD 05/29/14 2004  Vanetta Mulders, MD 05/29/14 0981  Vanetta Mulders, MD 05/29/14 2120

## 2014-05-29 NOTE — Discharge Instructions (Signed)
CT scan of head and face without any significant injuries. Take the Naprosyn as directed. Rest for the next 2 days. Work note provided. Return for any new or worse symptoms.

## 2014-05-29 NOTE — ED Notes (Signed)
Pt S&O x4, slowed but ineligible speech. Swelling noted to left cheek, states he also hit his head at location of previous craniotomy to right side. Pt states he took 1 of his fathers prescribed 7.5/325 percocet prior to arival to ED around 5pm.

## 2014-05-29 NOTE — ED Notes (Signed)
Discharge instructions given, pt demonstrated teach back and verbal understanding. Information given on PCP's in the are for follow up.No concerns voiced.
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
## 2014-06-17 ENCOUNTER — Encounter: Payer: Self-pay | Admitting: Physical Medicine & Rehabilitation

## 2014-07-23 ENCOUNTER — Other Ambulatory Visit: Payer: Self-pay | Admitting: Physical Medicine & Rehabilitation

## 2014-07-23 ENCOUNTER — Encounter: Payer: Self-pay | Attending: Physical Medicine & Rehabilitation | Admitting: Physical Medicine & Rehabilitation

## 2014-07-23 ENCOUNTER — Encounter: Payer: Self-pay | Admitting: Physical Medicine & Rehabilitation

## 2014-07-23 VITALS — BP 157/74 | HR 65 | Resp 14

## 2014-07-23 DIAGNOSIS — G44309 Post-traumatic headache, unspecified, not intractable: Secondary | ICD-10-CM | POA: Insufficient documentation

## 2014-07-23 DIAGNOSIS — G894 Chronic pain syndrome: Secondary | ICD-10-CM

## 2014-07-23 DIAGNOSIS — Z5181 Encounter for therapeutic drug level monitoring: Secondary | ICD-10-CM

## 2014-07-23 DIAGNOSIS — Z87820 Personal history of traumatic brain injury: Secondary | ICD-10-CM | POA: Insufficient documentation

## 2014-07-23 DIAGNOSIS — F341 Dysthymic disorder: Secondary | ICD-10-CM

## 2014-07-23 DIAGNOSIS — Z79899 Other long term (current) drug therapy: Secondary | ICD-10-CM

## 2014-07-23 DIAGNOSIS — H8113 Benign paroxysmal vertigo, bilateral: Secondary | ICD-10-CM

## 2014-07-23 DIAGNOSIS — F329 Major depressive disorder, single episode, unspecified: Secondary | ICD-10-CM | POA: Insufficient documentation

## 2014-07-23 DIAGNOSIS — G44301 Post-traumatic headache, unspecified, intractable: Secondary | ICD-10-CM

## 2014-07-23 DIAGNOSIS — S064X2D Epidural hemorrhage with loss of consciousness of 31 minutes to 59 minutes, subsequent encounter: Secondary | ICD-10-CM

## 2014-07-23 DIAGNOSIS — H8110 Benign paroxysmal vertigo, unspecified ear: Secondary | ICD-10-CM | POA: Insufficient documentation

## 2014-07-23 MED ORDER — TOPIRAMATE 25 MG PO TABS: 25.0000 mg | ORAL_TABLET | Freq: Every day | ORAL | Status: DC

## 2014-07-23 MED ORDER — CITALOPRAM HYDROBROMIDE 20 MG PO TABS: 20.0000 mg | ORAL_TABLET | Freq: Every day | ORAL | Status: DC
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
## 2014-07-23 NOTE — Patient Instructions (Signed)
ONCE I HAVE CONFIRMATION THAT YOUR URINE SPECIMEN IS CONSISTENT WITH YOUR HISTORY AND PRESCRIBED MEDICATIONS, I WILL BE WILLING TO PRESCRIBE YOUR PAIN MEDICATION. THE RESULTS OF YOUR URINE TESTING COULD TAKE A WEEK OR MORE TO RETURN, HOWEVER.  IF WE DO NOT CONTACT YOU REGARDING THESE RESULTS WITHIN 10 DAYS, PLEASE CONTACT US.     

## 2014-07-24 LAB — PMP ALCOHOL METABOLITE (ETG): Ethyl Glucuronide (EtG): NEGATIVE ng/mL

## 2014-07-27 LAB — OPIATES/OPIOIDS (LC/MS-MS)
Codeine Urine: NEGATIVE ng/mL (ref <50)
HYDROMORPHONE: NEGATIVE ng/mL (ref <50)
Hydrocodone: NEGATIVE ng/mL (ref <50)
Morphine Urine: NEGATIVE ng/mL (ref <50)
Norhydrocodone, Ur: NEGATIVE ng/mL (ref <50)
Noroxycodone, Ur: 6750 ng/mL (ref <50)
OXYCODONE, UR: 2325 ng/mL (ref <50)
Oxymorphone: 6407 ng/mL (ref <50)

## 2014-07-27 LAB — BENZODIAZEPINES (GC/LC/MS), URINE
Alprazolam metabolite (GC/LC/MS), ur confirm: 645 ng/mL — AB (ref <25)
Clonazepam metabolite (GC/LC/MS), ur confirm: NEGATIVE ng/mL (ref <25)
FLURAZEPAMU: NEGATIVE ng/mL (ref <50)
LORAZEPAMU: NEGATIVE ng/mL (ref <50)
MIDAZOLAMU: NEGATIVE ng/mL (ref <50)
Nordiazepam (GC/LC/MS), ur confirm: NEGATIVE ng/mL (ref <50)
Oxazepam (GC/LC/MS), ur confirm: NEGATIVE ng/mL (ref <50)
TRIAZOLAMU: NEGATIVE ng/mL (ref <50)
Temazepam (GC/LC/MS), ur confirm: NEGATIVE ng/mL (ref <50)

## 2014-07-27 LAB — BUPRENORPHINES (GC/LC/MS), URINE
BUPRENORPHINE (GC/LC/MS): 251 ng/mL — AB (ref <2)
Norbuprenorphine: 77 ng/mL — AB (ref <2)

## 2014-07-27 LAB — OXYCODONE, URINE (LC/MS-MS)
NOROXYCODONE, UR: 6750 ng/mL (ref <50)
Oxycodone, ur: 2325 ng/mL (ref <50)
Oxymorphone: 6407 ng/mL (ref <50)

## 2014-07-27 LAB — CANNABANOIDS (GC/LC/MS), URINE: THC-COOH (GC/LC/MS), ur confirm: 1017 ng/mL — AB (ref <5)

## 2014-07-29 LAB — PRESCRIPTION MONITORING PROFILE (SOLSTAS)
AMPHETAMINE/METH: NEGATIVE ng/mL
BARBITURATE SCREEN, URINE: NEGATIVE ng/mL
Carisoprodol, Urine: NEGATIVE ng/mL
Cocaine Metabolites: NEGATIVE ng/mL
Creatinine, Urine: 188.94 mg/dL (ref >20.0)
ECSTASY: NEGATIVE ng/mL
Fentanyl, Ur: NEGATIVE ng/mL
Meperidine, Ur: NEGATIVE ng/mL
Methadone Screen, Urine: NEGATIVE ng/mL
NITRITES URINE, INITIAL: NEGATIVE ug/mL
PH URINE, INITIAL: 7.8 pH (ref 4.5–8.9)
Propoxyphene: NEGATIVE ng/mL
TRAMADOL UR: NEGATIVE ng/mL
Tapentadol, urine: NEGATIVE ng/mL
Zolpidem, Urine: NEGATIVE ng/mL

## 2014-08-13 NOTE — Progress Notes (Signed)
Urine drug screen for this encounter is inconsistent.  Test is positive for marijuana, non prescribed alprazolam, buprenorphine,  and he reported taking oxycodone only the day before test but levels are very high and does not match his verbal report.

## 2014-08-19 ENCOUNTER — Ambulatory Visit: Payer: Self-pay | Admitting: Registered Nurse

## 2014-08-26 ENCOUNTER — Telehealth: Payer: Self-pay | Admitting: *Deleted

## 2014-08-26 NOTE — Telephone Encounter (Signed)
08/19/14 appointment with Marcus Mora for follow up was cancelled due to Mr Marcus Mora is incarcerated.

## 2014-10-27 ENCOUNTER — Ambulatory Visit: Payer: Self-pay | Attending: Physical Medicine & Rehabilitation | Admitting: Physical Therapy

## 2015-06-03 ENCOUNTER — Emergency Department (HOSPITAL_COMMUNITY)
Admission: EM | Admit: 2015-06-03 | Discharge: 2015-06-03 | Disposition: A | Discharge status: Home / Self Care | Payer: Medicaid Other | Attending: Emergency Medicine | Admitting: Emergency Medicine

## 2015-06-03 ENCOUNTER — Encounter (HOSPITAL_COMMUNITY): Payer: Self-pay | Admitting: *Deleted

## 2015-06-03 ENCOUNTER — Emergency Department (HOSPITAL_COMMUNITY): Payer: Medicaid Other

## 2015-06-03 DIAGNOSIS — Z9114 Patient's other noncompliance with medication regimen: Secondary | ICD-10-CM

## 2015-06-03 DIAGNOSIS — S0083XA Contusion of other part of head, initial encounter: Secondary | ICD-10-CM | POA: Diagnosis not present

## 2015-06-03 DIAGNOSIS — G40909 Epilepsy, unspecified, not intractable, without status epilepticus: Secondary | ICD-10-CM | POA: Insufficient documentation

## 2015-06-03 DIAGNOSIS — Z9289 Personal history of other medical treatment: Secondary | ICD-10-CM | POA: Insufficient documentation

## 2015-06-03 DIAGNOSIS — Y9389 Activity, other specified: Secondary | ICD-10-CM | POA: Diagnosis not present

## 2015-06-03 DIAGNOSIS — Z9119 Patient's noncompliance with other medical treatment and regimen: Secondary | ICD-10-CM | POA: Insufficient documentation

## 2015-06-03 DIAGNOSIS — Y990 Civilian activity done for income or pay: Secondary | ICD-10-CM | POA: Diagnosis not present

## 2015-06-03 DIAGNOSIS — Z8781 Personal history of (healed) traumatic fracture: Secondary | ICD-10-CM | POA: Diagnosis not present

## 2015-06-03 DIAGNOSIS — S0990XA Unspecified injury of head, initial encounter: Secondary | ICD-10-CM

## 2015-06-03 DIAGNOSIS — W1839XA Other fall on same level, initial encounter: Secondary | ICD-10-CM | POA: Diagnosis not present

## 2015-06-03 DIAGNOSIS — R569 Unspecified convulsions: Secondary | ICD-10-CM

## 2015-06-03 DIAGNOSIS — S0081XA Abrasion of other part of head, initial encounter: Secondary | ICD-10-CM | POA: Diagnosis not present

## 2015-06-03 DIAGNOSIS — Z79899 Other long term (current) drug therapy: Secondary | ICD-10-CM | POA: Insufficient documentation

## 2015-06-03 DIAGNOSIS — J45909 Unspecified asthma, uncomplicated: Secondary | ICD-10-CM | POA: Diagnosis not present

## 2015-06-03 DIAGNOSIS — Y9289 Other specified places as the place of occurrence of the external cause: Secondary | ICD-10-CM | POA: Insufficient documentation

## 2015-06-03 LAB — CBC WITH DIFFERENTIAL/PLATELET
BASOS ABS: 0 10*3/uL (ref 0.0–0.1)
BASOS PCT: 0 %
Eosinophils Absolute: 0 10*3/uL (ref 0.0–0.7)
Eosinophils Relative: 0 %
HEMATOCRIT: 44 % (ref 39.0–52.0)
HEMOGLOBIN: 15.2 g/dL (ref 13.0–17.0)
LYMPHS PCT: 12 %
Lymphs Abs: 0.9 10*3/uL (ref 0.7–4.0)
MCH: 30.1 pg (ref 26.0–34.0)
MCHC: 34.5 g/dL (ref 30.0–36.0)
MCV: 87.1 fL (ref 78.0–100.0)
Monocytes Absolute: 0.5 10*3/uL (ref 0.1–1.0)
Monocytes Relative: 7 %
NEUTROS ABS: 6.1 10*3/uL (ref 1.7–7.7)
NEUTROS PCT: 81 %
Platelets: 162 10*3/uL (ref 150–400)
RBC: 5.05 MIL/uL (ref 4.22–5.81)
RDW: 12.5 % (ref 11.5–15.5)
WBC: 7.5 10*3/uL (ref 4.0–10.5)

## 2015-06-03 LAB — BASIC METABOLIC PANEL
ANION GAP: 10 (ref 5–15)
BUN: 15 mg/dL (ref 6–20)
CALCIUM: 10.6 mg/dL — AB (ref 8.9–10.3)
CO2: 30 mmol/L (ref 22–32)
Chloride: 103 mmol/L (ref 101–111)
Creatinine, Ser: 1.11 mg/dL (ref 0.61–1.24)
GLUCOSE: 100 mg/dL — AB (ref 65–99)
POTASSIUM: 4.2 mmol/L (ref 3.5–5.1)
Sodium: 143 mmol/L (ref 135–145)

## 2015-06-03 LAB — CBG MONITORING, ED: GLUCOSE-CAPILLARY: 101 mg/dL — AB (ref 65–99)

## 2015-06-03 MED ORDER — LEVETIRACETAM 500 MG PO TABS
500.0000 mg | ORAL_TABLET | Freq: Two times a day (BID) | ORAL | Status: DC
Start: 2015-06-03 — End: 2017-10-23

## 2015-06-03 MED ORDER — LEVETIRACETAM 500 MG PO TABS
500.0000 mg | ORAL_TABLET | Freq: Once | ORAL | Status: AC
  Administered 2015-06-03: 500 mg via ORAL
  Filled 2015-06-03: qty 1

## 2015-06-03 NOTE — ED Provider Notes (Signed)
CSN: [id number]     Arrival date & time 06/03/15  1559 History   First MD Initiated Contact with Patient 06/03/15 1657     Chief Complaint  Patient presents with  . Seizures     (Consider location/radiation/quality/duration/timing/severity/associated sxs/prior Treatment) HPI  Marcus Mora is a 30 y.o. male Presents for evaluation of Seizure with head injury. He has pain in the left side of his head.He stopped taking his Keppra, because he ran out of it. No other recent illnesses, or injuries. He was at work today when he suddenly "stiffened up" then fell and had a generalized shaking episode.He injured the left side of his head, when he fell.Here postictal state for about 20 minutes. On evaluation, is alert, calm, cooperative, and expresses no other concerns.There are no other known modifying factors.   Past Medical History  Diagnosis Date  . Asthma   . Temporal bone fracture (HCC) 03/04/14    Right  . Epidural hematoma (HCC) 03/04/14  . History of electroencephalogram 02/2014    normal  . Headache     constant since head injury 03/04/2014   Past Surgical History  Procedure Laterality Date  . Hernia repair    . Hernia    . Craniotomy N/A 03/04/2014    Procedure: CRANIOTOMY HEMATOMA EVACUATION EPIDURAL;  Surgeon: Temple Pacini, MD;  Location: [facility];  Service: Neurosurgery;  Laterality: N/A;   Family History  Problem Relation Age of Onset  . Thyroid disease Mother    Social History  Substance Use Topics  . Smoking status: Never Smoker   . Smokeless tobacco: None  . Alcohol Use: No     Comment: denies use 03/12/12    Review of Systems  All other systems reviewed and are negative.     Allergies  Hydrocodone  Home Medications   Prior to Admission medications   Medication Sig Start Date End Date Taking? Authorizing Provider  citalopram (CELEXA) 20 MG tablet Take 1 tablet (20 mg total) by mouth at bedtime. 07/23/14   Ranelle Oyster, MD  levETIRAcetam (KEPPRA)  500 MG tablet Take 1 tablet (500 mg total) by mouth 2 (two) times daily. 06/03/15   Mancel Bale, MD  topiramate (TOPAMAX) 25 MG tablet Take 1 tablet (25 mg total) by mouth at bedtime. 07/23/14   Ranelle Oyster, MD   BP 113/71 mmHg  Pulse 59  Temp(Src) 98 F (36.7 C) (Oral)  Resp 13  SpO2 100% Physical Exam  Constitutional: He is oriented to person, place, and time. He appears well-developed and well-nourished.  HENT:  Head: Normocephalic and atraumatic.  Right Ear: External ear normal.  Left Ear: External ear normal.  Small contusion and abrasion, left temporal region.  Eyes: Conjunctivae and EOM are normal. Pupils are equal, round, and reactive to light.  Neck: Normal range of motion and phonation normal. Neck supple.  Cardiovascular: Normal rate, regular rhythm and normal heart sounds.   Pulmonary/Chest: Effort normal and breath sounds normal. He exhibits no bony tenderness.  Abdominal: Soft. There is no tenderness.  Musculoskeletal: Normal range of motion.  Neurological: He is alert and oriented to person, place, and time. No cranial nerve deficit or sensory deficit. He exhibits normal muscle tone. Coordination normal.  No dysarthria, aphasia or nystagmus  Skin: Skin is warm, dry and intact.  Psychiatric: He has a normal mood and affect. His behavior is normal. Judgment and thought content normal.  Nursing note and vitals reviewed.   ED Course  Procedures (  including critical care time)  Medications  levETIRAcetam (KEPPRA) tablet 500 mg (not administered)    Patient Vitals for the past 24 hrs:  BP Temp Temp src Pulse Resp SpO2  06/03/15 1839 113/71 mmHg - - (!) 59 13 100 %  06/03/15 1629 122/81 mmHg 98 F (36.7 C) Oral 65 15 96 %    8:04 PM Reevaluation with update and discussion. After initial assessment and treatment, an updated evaluation reveals no change in status. No seizure in ED. Findings discussed with patient, and girlfriend, all questions were answered.  Kadeem Hyle L    Labs Review Labs Reviewed  BASIC METABOLIC PANEL - Abnormal; Notable for the following:    Glucose, Bld 100 (*)    Calcium 10.6 (*)    All other components within normal limits  CBG MONITORING, ED - Abnormal; Notable for the following:    Glucose-Capillary 101 (*)    All other components within normal limits  CBC WITH DIFFERENTIAL/PLATELET    Imaging Review Ct Head Wo Contrast  06/03/2015  CLINICAL DATA:  Seizure today. History of prior head injury. Initial encounter. EXAM: CT HEAD WITHOUT CONTRAST TECHNIQUE: Contiguous axial images were obtained from the base of the skull through the vertex without intravenous contrast. COMPARISON:  Head CT scan 05/29/2014 hand 03/26/2015. FINDINGS: Right craniotomy defect is again seen. The brain appears normal without hemorrhage, infarct, mass lesion, mass effect, midline shift or abnormal extra-axial fluid collection. No hydrocephalus. No fracture. Imaged paranasal sinuses and mastoid air cells are clear. IMPRESSION: No acute abnormality. Electronically Signed   By: Drusilla Kannerhomas  Dalessio M.D.   On: 06/03/2015 19:47   I have personally reviewed and evaluated these images and lab results as part of my medical decision-making.   EKG Interpretation None      MDM   Final diagnoses:  Seizure (HCC)  Noncompliance with medications  Head injury, initial encounter    Seizure disorder with prior seizure history. Patient is noncompliant with his antiepileptic medication. No evident causative factor other than medication noncompliance.  Nursing Notes Reviewed/ Care Coordinated Applicable Imaging Reviewed Interpretation of Laboratory Data incorporated into ED treatment  The patient appears reasonably screened and/or stabilized for discharge and I doubt any other medical condition or other [facility] requiring further screening, evaluation, or treatment in the ED at this time prior to discharge.  Plan: Home Medications- Problem; Home Treatments-  Rest; return here if the recommended treatment, does not improve the symptoms; Recommended follow up- pCP, when necessary     Mancel BaleElliott Mubashir Mallek, MD 06/03/15 2349

## 2015-06-03 NOTE — Discharge Instructions (Signed)
Epilepsy °Epilepsy is a disorder in which a person has repeated seizures over time. A seizure is a release of abnormal electrical activity in the brain. Seizures can cause a change in attention, behavior, or the ability to remain awake and alert (altered mental status). Seizures often involve uncontrollable shaking (convulsions).  °Most people with epilepsy lead normal lives. However, people with epilepsy are at an increased risk of falls, accidents, and injuries. Therefore, it is important to begin treatment right away. °CAUSES  °Epilepsy has many possible causes. Anything that disturbs the normal pattern of brain cell activity can lead to seizures. This may include:  °· Head injury. °· Birth trauma. °· High fever as a child. °· Stroke. °· Bleeding into or around the brain. °· Certain drugs. °· Prolonged low oxygen, such as what occurs after CPR efforts. °· Abnormal brain development. °· Certain illnesses, such as meningitis, encephalitis (brain infection), malaria, and other infections. °· An imbalance of nerve signaling chemicals (neurotransmitters).   °SIGNS AND SYMPTOMS  °The symptoms of a seizure can vary greatly from one person to another. Right before a seizure, you may have a warning (aura) that a seizure is about to occur. An aura may include the following symptoms: °· Fear or anxiety. °· Nausea. °· Feeling like the room is spinning (vertigo). °· Vision changes, such as seeing flashing lights or spots. °Common symptoms during a seizure include: °· Abnormal sensations, such as an abnormal smell or a bitter taste in the mouth.   °· Sudden, general body stiffness.   °· Convulsions that involve rhythmic jerking of the face, arm, or leg on one or both sides.   °· Sudden change in consciousness.   °· Appearing to be awake but not responding.   °· Appearing to be asleep but cannot be awakened.   °· Grimacing, chewing, lip smacking, drooling, tongue biting, or loss of bowel or bladder control. °After a seizure,  you may feel sleepy for a while.  °DIAGNOSIS  °Your health care provider will ask about your symptoms and take a medical history. Descriptions from any witnesses to your seizures will be very helpful in the diagnosis. A physical exam, including a detailed neurological exam, is necessary. Various tests may be done, such as:  °· An electroencephalogram (EEG). This is a painless test of your brain waves. In this test, a diagram is created of your brain waves. These diagrams can be interpreted by a specialist. °· An MRI of the brain.   °· A CT scan of the brain.   °· A spinal tap (lumbar puncture, LP). °· Blood tests to check for signs of infection or abnormal blood chemistry. °TREATMENT  °There is no cure for epilepsy, but it is generally treatable. Once epilepsy is diagnosed, it is important to begin treatment as soon as possible. For most people with epilepsy, seizures can be controlled with medicines. The following may also be used: °· A pacemaker for the brain (vagus nerve stimulator) can be used for people with seizures that are not well controlled by medicine. °· Surgery on the brain. °For some people, epilepsy eventually goes away. °HOME CARE INSTRUCTIONS  °· Follow your health care provider's recommendations on driving and safety in normal activities. °· Get enough rest. Lack of sleep can cause seizures. °· Only take over-the-counter or prescription medicines as directed by your health care provider. Take any prescribed medicine exactly as directed. °· Avoid any known triggers of your seizures. °· Keep a seizure diary. Record what you recall about any seizure, especially any possible trigger.   °· Make   sure the people you live and work with know that you are prone to seizures. They should receive instructions on how to help you. In general, a witness to a seizure should:   Cushion your head and body.   Turn you on your side.   Avoid unnecessarily restraining you.   Not place anything inside your  mouth.   Call for emergency medical help if there is any question about what has occurred.   Follow up with your health care provider as directed. You may need regular blood tests to monitor the levels of your medicine.  SEEK MEDICAL CARE IF:   You develop signs of infection or other illness. This might increase the risk of a seizure.   You seem to be having more frequent seizures.   Your seizure pattern is changing.  SEEK IMMEDIATE MEDICAL CARE IF:   You have a seizure that does not stop after a few moments.   You have a seizure that causes any difficulty in breathing.   You have a seizure that results in a very severe headache.   You have a seizure that leaves you with the inability to speak or use a part of your body.    This information is not intended to replace advice given to you by your health care provider. Make sure you discuss any questions you have with your health care provider.   Document Released: 03/14/2005 Document Revised: 01/02/2013 Document Reviewed: 10/24/2012 Elsevier Interactive Patient Education 2016 [employer]    Head Injury, Adult You have a head injury. Headaches and throwing up (vomiting) are common after a head injury. It should be easy to wake up from sleeping. Sometimes you must stay in the hospital. Most problems happen within the first 24 hours. Side effects may occur up to 7-10 days after the injury.  WHAT ARE THE TYPES OF HEAD INJURIES? Head injuries can be as minor as a bump. Some head injuries can be more severe. More severe head injuries include:  A jarring injury to the brain (concussion).  A bruise of the brain (contusion). This mean there is bleeding in the brain that can cause swelling.  A cracked skull (skull fracture).  Bleeding in the brain that collects, clots, and forms a bump (hematoma). WHEN SHOULD I GET HELP RIGHT AWAY?   You are confused or sleepy.  You cannot be woken up.  You feel sick to your stomach  (nauseous) or keep throwing up (vomiting).  Your dizziness or unsteadiness is getting worse.  You have very bad, lasting headaches that are not helped by medicine. Take medicines only as told by your doctor.  You cannot use your arms or legs like normal.  You cannot walk.  You notice changes in the black spots in the center of the colored part of your eye (pupil).  You have clear or bloody fluid coming from your nose or ears.  You have trouble seeing. During the next 24 hours after the injury, you must stay with someone who can watch you. This person should get help right away (call 911 in the [place] if you start to shake and are not able to control it (have seizures), you pass out, or you are unable to wake up. HOW CAN I PREVENT A HEAD INJURY IN THE FUTURE?  Wear seat belts.  Wear a helmet while bike riding and playing sports like football.  Stay away from dangerous activities around the house. WHEN CAN I RETURN TO NORMAL ACTIVITIES AND ATHLETICS?  See your doctor before doing these activities. You should not do normal activities or play contact sports until 1 week after the following symptoms have stopped:  Headache that does not go away.  Dizziness.  Poor attention.  Confusion.  Memory problems.  Sickness to your stomach or throwing up.  Tiredness.  Fussiness.  Bothered by bright lights or loud noises.  Anxiousness or depression.  Restless sleep. MAKE SURE YOU:   Understand these instructions.  Will watch your condition.  Will get help right away if you are not doing well or get worse.   This information is not intended to replace advice given to you by your health care provider. Make sure you discuss any questions you have with your health care provider.   Document Released: 02/25/2008 Document Revised: 04/04/2014 Document Reviewed: 11/19/2012 Elsevier Interactive Patient Education [employer]
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
## 2017-10-23 ENCOUNTER — Emergency Department (HOSPITAL_COMMUNITY): Payer: Medicaid Other

## 2017-10-23 ENCOUNTER — Encounter (HOSPITAL_COMMUNITY): Payer: Self-pay | Admitting: *Deleted

## 2017-10-23 ENCOUNTER — Other Ambulatory Visit: Payer: Self-pay

## 2017-10-23 ENCOUNTER — Emergency Department (HOSPITAL_COMMUNITY)
Admission: EM | Admit: 2017-10-23 | Discharge: 2017-10-24 | Disposition: A | Discharge status: Home / Self Care | Payer: Medicaid Other | Source: Home / Self Care | Attending: Emergency Medicine | Admitting: Emergency Medicine

## 2017-10-23 DIAGNOSIS — Z79899 Other long term (current) drug therapy: Secondary | ICD-10-CM | POA: Diagnosis not present

## 2017-10-23 DIAGNOSIS — J45909 Unspecified asthma, uncomplicated: Secondary | ICD-10-CM | POA: Diagnosis not present

## 2017-10-23 DIAGNOSIS — W260XXA Contact with knife, initial encounter: Secondary | ICD-10-CM | POA: Diagnosis not present

## 2017-10-23 DIAGNOSIS — S81012A Laceration without foreign body, left knee, initial encounter: Secondary | ICD-10-CM | POA: Diagnosis not present

## 2017-10-23 DIAGNOSIS — F329 Major depressive disorder, single episode, unspecified: Secondary | ICD-10-CM | POA: Diagnosis not present

## 2017-10-23 DIAGNOSIS — Y93H9 Activity, other involving exterior property and land maintenance, building and construction: Secondary | ICD-10-CM | POA: Diagnosis not present

## 2017-10-23 DIAGNOSIS — Z885 Allergy status to narcotic agent status: Secondary | ICD-10-CM | POA: Diagnosis not present
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
## 2017-10-24 ENCOUNTER — Encounter (HOSPITAL_BASED_OUTPATIENT_CLINIC_OR_DEPARTMENT_OTHER): Payer: Self-pay | Admitting: *Deleted

## 2017-10-24 ENCOUNTER — Ambulatory Visit (HOSPITAL_BASED_OUTPATIENT_CLINIC_OR_DEPARTMENT_OTHER): Payer: Medicaid Other | Admitting: Anesthesiology

## 2017-10-24 ENCOUNTER — Ambulatory Visit (HOSPITAL_COMMUNITY)
Admission: RE | Admit: 2017-10-24 | Discharge: 2017-10-24 | Disposition: A | Discharge status: Home / Self Care | Payer: Medicaid Other | Source: Ambulatory Visit | Attending: Orthopedic Surgery | Admitting: Orthopedic Surgery

## 2017-10-24 ENCOUNTER — Encounter (HOSPITAL_BASED_OUTPATIENT_CLINIC_OR_DEPARTMENT_OTHER)
Admission: RE | Disposition: A | Discharge status: Home / Self Care | Payer: Self-pay | Source: Ambulatory Visit | Attending: Orthopedic Surgery

## 2017-10-24 ENCOUNTER — Other Ambulatory Visit: Payer: Self-pay

## 2017-10-24 DIAGNOSIS — Z79899 Other long term (current) drug therapy: Secondary | ICD-10-CM | POA: Diagnosis not present

## 2017-10-24 DIAGNOSIS — J45909 Unspecified asthma, uncomplicated: Secondary | ICD-10-CM | POA: Insufficient documentation

## 2017-10-24 DIAGNOSIS — Z885 Allergy status to narcotic agent status: Secondary | ICD-10-CM | POA: Insufficient documentation

## 2017-10-24 DIAGNOSIS — S81012A Laceration without foreign body, left knee, initial encounter: Secondary | ICD-10-CM | POA: Diagnosis present

## 2017-10-24 DIAGNOSIS — F329 Major depressive disorder, single episode, unspecified: Secondary | ICD-10-CM | POA: Insufficient documentation

## 2017-10-24 DIAGNOSIS — W260XXA Contact with knife, initial encounter: Secondary | ICD-10-CM | POA: Insufficient documentation

## 2017-10-24 DIAGNOSIS — Y93H9 Activity, other involving exterior property and land maintenance, building and construction: Secondary | ICD-10-CM | POA: Insufficient documentation

## 2017-10-24 HISTORY — DX: Laceration without foreign body, left knee, initial encounter: S81.012A

## 2017-10-24 HISTORY — PX: KNEE ARTHROSCOPY: SHX127

## 2017-10-24 SURGERY — ARTHROSCOPY, KNEE
Anesthesia: General | Site: Knee | Laterality: Left

## 2017-10-24 MED ORDER — LACTATED RINGERS IV SOLN: INTRAVENOUS | Status: DC

## 2017-10-24 MED ORDER — SODIUM CHLORIDE 0.9 % IR SOLN
Status: DC | PRN
  Administered 2017-10-24: 6000 mL

## 2017-10-24 MED ORDER — FENTANYL CITRATE (PF) 100 MCG/2ML IJ SOLN
25.0000 ug | INTRAMUSCULAR | Status: DC | PRN
  Administered 2017-10-24: 50 ug via INTRAVENOUS
  Administered 2017-10-24 (×2): 25 ug via INTRAVENOUS

## 2017-10-24 MED ORDER — ONDANSETRON HCL 4 MG/2ML IJ SOLN: 4.0000 mg | Freq: Four times a day (QID) | INTRAMUSCULAR | Status: DC | PRN

## 2017-10-24 MED ORDER — AMOXICILLIN-POT CLAVULANATE 875-125 MG PO TABS
1.0000 | ORAL_TABLET | Freq: Once | ORAL | Status: AC
  Administered 2017-10-24: 1 via ORAL
  Filled 2017-10-24: qty 1

## 2017-10-24 MED ORDER — CEFAZOLIN SODIUM-DEXTROSE 2-4 GM/100ML-% IV SOLN
INTRAVENOUS | Status: AC
  Filled 2017-10-24: qty 100

## 2017-10-24 MED ORDER — FENTANYL CITRATE (PF) 100 MCG/2ML IJ SOLN: 50.0000 ug | INTRAMUSCULAR | Status: DC | PRN

## 2017-10-24 MED ORDER — SCOPOLAMINE 1 MG/3DAYS TD PT72: 1.0000 | MEDICATED_PATCH | Freq: Once | TRANSDERMAL | Status: DC | PRN

## 2017-10-24 MED ORDER — OXYCODONE HCL 5 MG PO TABS
ORAL_TABLET | ORAL | Status: AC
  Filled 2017-10-24: qty 1

## 2017-10-24 MED ORDER — OXYCODONE HCL 5 MG PO TABS
5.0000 mg | ORAL_TABLET | Freq: Once | ORAL | Status: AC | PRN
  Administered 2017-10-24: 5 mg via ORAL

## 2017-10-24 MED ORDER — ACETAMINOPHEN 500 MG PO TABS
ORAL_TABLET | ORAL | Status: AC
  Filled 2017-10-24: qty 2

## 2017-10-24 MED ORDER — MIDAZOLAM HCL 2 MG/2ML IJ SOLN
INTRAMUSCULAR | Status: AC
  Filled 2017-10-24: qty 2

## 2017-10-24 MED ORDER — SENNA-DOCUSATE SODIUM 8.6-50 MG PO TABS: 2.0000 | ORAL_TABLET | Freq: Every day | ORAL | 1 refills | Status: DC

## 2017-10-24 MED ORDER — DEXAMETHASONE SODIUM PHOSPHATE 10 MG/ML IJ SOLN: 8.0000 mg | Freq: Once | INTRAMUSCULAR | Status: DC

## 2017-10-24 MED ORDER — BUPIVACAINE-EPINEPHRINE (PF) 0.25% -1:200000 IJ SOLN
INTRAMUSCULAR | Status: AC
  Filled 2017-10-24: qty 30

## 2017-10-24 MED ORDER — ACETAMINOPHEN 500 MG PO TABS
1000.0000 mg | ORAL_TABLET | Freq: Once | ORAL | Status: AC
  Administered 2017-10-24: 1000 mg via ORAL

## 2017-10-24 MED ORDER — FENTANYL CITRATE (PF) 100 MCG/2ML IJ SOLN
INTRAMUSCULAR | Status: AC
  Filled 2017-10-24: qty 2

## 2017-10-24 MED ORDER — OXYCODONE HCL 5 MG PO TABS: 5.0000 mg | ORAL_TABLET | ORAL | 0 refills | Status: DC | PRN

## 2017-10-24 MED ORDER — MIDAZOLAM HCL 2 MG/2ML IJ SOLN: 1.0000 mg | INTRAMUSCULAR | Status: DC | PRN

## 2017-10-24 MED ORDER — OXYCODONE-ACETAMINOPHEN 5-325 MG PO TABS
2.0000 | ORAL_TABLET | Freq: Once | ORAL | Status: AC
  Administered 2017-10-24: 2 via ORAL
  Filled 2017-10-24: qty 2

## 2017-10-24 MED ORDER — BUPIVACAINE HCL (PF) 0.5 % IJ SOLN
INTRAMUSCULAR | Status: AC
  Filled 2017-10-24: qty 30

## 2017-10-24 MED ORDER — OXYCODONE HCL 5 MG/5ML PO SOLN: 5.0000 mg | Freq: Once | ORAL | Status: AC | PRN

## 2017-10-24 MED ORDER — GABAPENTIN 300 MG PO CAPS
ORAL_CAPSULE | ORAL | Status: AC
  Filled 2017-10-24: qty 1

## 2017-10-24 MED ORDER — ONDANSETRON HCL 4 MG PO TABS: 4.0000 mg | ORAL_TABLET | Freq: Three times a day (TID) | ORAL | 0 refills | Status: DC | PRN

## 2017-10-24 MED ORDER — GABAPENTIN 300 MG PO CAPS
300.0000 mg | ORAL_CAPSULE | Freq: Once | ORAL | Status: AC
  Administered 2017-10-24: 300 mg via ORAL

## 2017-10-24 MED ORDER — CEFAZOLIN SODIUM-DEXTROSE 2-4 GM/100ML-% IV SOLN
2.0000 g | INTRAVENOUS | Status: AC
  Administered 2017-10-24: 2 g via INTRAVENOUS

## 2017-10-24 MED ORDER — AMOXICILLIN-POT CLAVULANATE 875-125 MG PO TABS: 1.0000 | ORAL_TABLET | Freq: Two times a day (BID) | ORAL | 0 refills | Status: DC

## 2017-10-24 SURGICAL SUPPLY — 41 items
BANDAGE ACE 6X5 VEL STRL LF (GAUZE/BANDAGES/DRESSINGS) ×3 IMPLANT
BANDAGE ESMARK 6X9 LF (GAUZE/BANDAGES/DRESSINGS) IMPLANT
BLADE CUTTER GATOR 3.5 (BLADE) ×3 IMPLANT
BNDG ESMARK 6X9 LF (GAUZE/BANDAGES/DRESSINGS)
CLOSURE STERI-STRIP 1/2X4 (GAUZE/BANDAGES/DRESSINGS) ×1
CLSR STERI-STRIP ANTIMIC 1/2X4 (GAUZE/BANDAGES/DRESSINGS) ×2 IMPLANT
CUFF TOURNIQUET SINGLE 34IN LL (TOURNIQUET CUFF) ×3 IMPLANT
CUTTER MENISCUS  4.2MM (BLADE)
CUTTER MENISCUS 4.2MM (BLADE) IMPLANT
CUTTER TENSIONER SUT 2-0 0 FBW (INSTRUMENTS) IMPLANT
DRAPE ARTHROSCOPY W/POUCH 90 (DRAPES) ×3 IMPLANT
DRAPE IMP U-DRAPE 54X76 (DRAPES) ×3 IMPLANT
DURAPREP 26ML APPLICATOR (WOUND CARE) ×3 IMPLANT
ELECT MENISCUS 165MM 90D (ELECTRODE) IMPLANT
ELECT REM PT RETURN 9FT ADLT (ELECTROSURGICAL) ×3
ELECTRODE REM PT RTRN 9FT ADLT (ELECTROSURGICAL) ×1 IMPLANT
GAUZE SPONGE 4X4 12PLY STRL (GAUZE/BANDAGES/DRESSINGS) ×3 IMPLANT
GLOVE BIO SURGEON STRL SZ8 (GLOVE) ×3 IMPLANT
GLOVE BIOGEL PI IND STRL 7.0 (GLOVE) ×2 IMPLANT
GLOVE BIOGEL PI IND STRL 8 (GLOVE) ×2 IMPLANT
GLOVE BIOGEL PI INDICATOR 7.0 (GLOVE) ×4
GLOVE BIOGEL PI INDICATOR 8 (GLOVE) ×4
GLOVE ORTHO TXT STRL SZ7.5 (GLOVE) ×3 IMPLANT
GLOVE SURG SYN 7.5  E (GLOVE) ×2
GLOVE SURG SYN 7.5 E (GLOVE) ×1 IMPLANT
GOWN STRL REUS W/ TWL LRG LVL3 (GOWN DISPOSABLE) ×1 IMPLANT
GOWN STRL REUS W/ TWL XL LVL3 (GOWN DISPOSABLE) ×2 IMPLANT
GOWN STRL REUS W/TWL LRG LVL3 (GOWN DISPOSABLE) ×2
GOWN STRL REUS W/TWL XL LVL3 (GOWN DISPOSABLE) ×4
KNEE WRAP E Z 3 GEL PACK (MISCELLANEOUS) ×3 IMPLANT
MANIFOLD NEPTUNE II (INSTRUMENTS) ×3 IMPLANT
PACK ARTHROSCOPY DSU (CUSTOM PROCEDURE TRAY) ×3 IMPLANT
PACK BASIN DAY SURGERY FS (CUSTOM PROCEDURE TRAY) ×3 IMPLANT
PENCIL BUTTON HOLSTER BLD 10FT (ELECTRODE) IMPLANT
PROBE BIPOLAR ATHRO 135MM 90D (MISCELLANEOUS) IMPLANT
SLEEVE SCD COMPRESS KNEE MED (MISCELLANEOUS) ×3 IMPLANT
SUT ETHILON 3 0 PS 1 (SUTURE) ×3 IMPLANT
SUT MNCRL AB 4-0 PS2 18 (SUTURE) ×3 IMPLANT
TOWEL GREEN STERILE FF (TOWEL DISPOSABLE) ×3 IMPLANT
TUBING ARTHRO INFLOW-ONLY STRL (TUBING) ×3 IMPLANT
WATER STERILE IRR 1000ML POUR (IV SOLUTION) ×3 IMPLANT

## 2017-10-24 NOTE — Discharge Instructions (Signed)
No Tylenol until 7:15pm  Diet: As you were doing prior to hospitalization   Shower:  May shower but keep the wounds dry, use an occlusive plastic wrap, NO SOAKING IN TUB.  If the bandage gets wet, change with a clean dry gauze.  If you have a splint on, leave the splint in place and keep the splint dry with a plastic bag.  Dressing:  You may change your dressing 3-5 days after surgery, unless you have a splint.  If you have a splint, then just leave the splint in place and we will change your bandages during your first follow-up appointment.    If you had hand or foot surgery, we will plan to remove your stitches in about 2 weeks in the office.  For all other surgeries, there are sticky tapes (steri-strips) on your wounds and all the stitches are absorbable.  Leave the steri-strips in place when changing your dressings, they will peel off with time, usually 2-3 weeks.  Activity:  Increase activity slowly as tolerated, but follow the weight bearing instructions below.  The rules on driving is that you can not be taking narcotics while you drive, and you must feel in control of the vehicle.    Weight Bearing:   As tolerated.    To prevent constipation: you may use a stool softener such as -  Colace (over the counter) 100 mg by mouth twice a day  Drink plenty of fluids (prune juice may be helpful) and high fiber foods Miralax (over the counter) for constipation as needed.    Itching:  If you experience itching with your medications, try taking only a single pain pill, or even half a pain pill at a time.  You may take up to 10 pain pills per day, and you can also use benadryl over the counter for itching or also to help with sleep.   Precautions:  If you experience chest pain or shortness of breath - call 911 immediately for transfer to the hospital emergency department!!  If you develop a fever greater that 101 F, purulent drainage from wound, increased redness or drainage from wound, or calf  pain -- Call the office at [phone]                                                Follow- Up Appointment:  Please call for an appointment to be seen in 2 weeks [place] - [phone]    Post Anesthesia Home Care Instructions  Activity: Get plenty of rest for the remainder of the day. A responsible individual must stay with you for 24 hours following the procedure.  For the next 24 hours, DO NOT: -Drive a car -Advertising copywriter -Drink alcoholic beverages -Take any medication unless instructed by your physician -Make any legal decisions or sign important papers.  Meals: Start with liquid foods such as gelatin or soup. Progress to regular foods as tolerated. Avoid greasy, spicy, heavy foods. If nausea and/or vomiting occur, drink only clear liquids until the nausea and/or vomiting subsides. Call your physician if vomiting continues.  Special Instructions/Symptoms: Your throat may feel dry or sore from the anesthesia or the breathing tube placed in your throat during surgery. If this causes discomfort, gargle with warm salt water. The discomfort should disappear within 24 hours.  If you had a scopolamine patch placed behind your ear  for the management of post- operative nausea and/or vomiting:  1. The medication in the patch is effective for 72 hours, after which it should be removed.  Wrap patch in a tissue and discard in the trash. Wash hands thoroughly with soap and water. 2. You may remove the patch earlier than 72 hours if you experience unpleasant side effects which may include dry mouth, dizziness or visual disturbances. 3. Avoid touching the patch. Wash your hands with soap and water after contact with the patch.

## 2017-10-24 NOTE — Anesthesia Preprocedure Evaluation (Signed)
Anesthesia Evaluation  Patient identified by MRN, date of birth, ID band Patient awake    Reviewed: Allergy & Precautions, H&P , NPO status , Patient's Chart, lab work & pertinent test results  Airway Mallampati: II   Neck ROM: full    Dental   Pulmonary asthma ,    breath sounds clear to auscultation       Cardiovascular negative cardio ROS   Rhythm:regular Rate:Normal     Neuro/Psych  Headaches, PSYCHIATRIC DISORDERS Depression H/o head injury (2015). Temporal bone fx with epidural hematoma.  S/p craniotomy    GI/Hepatic   Endo/Other    Renal/GU      Musculoskeletal   Abdominal   Peds  Hematology   Anesthesia Other Findings   Reproductive/Obstetrics                             Anesthesia Physical Anesthesia Plan  ASA: II  Anesthesia Plan: General   Post-op Pain Management:    Induction: Intravenous  PONV Risk Score and Plan: 2 and Ondansetron, Dexamethasone, Midazolam and Treatment may vary due to age or medical condition  Airway Management Planned: LMA  Additional Equipment:   Intra-op Plan:   Post-operative Plan:   Informed Consent: I have reviewed the patients History and Physical, chart, labs and discussed the procedure including the risks, benefits and alternatives for the proposed anesthesia with the patient or authorized representative who has indicated his/her understanding and acceptance.     Plan Discussed with: CRNA, Anesthesiologist and Surgeon  Anesthesia Plan Comments:         Anesthesia Quick Evaluation

## 2017-10-24 NOTE — ED Notes (Signed)
Pt ambulatory to waiting room. Pt verbalized understanding of discharge instructions.   
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
## 2017-10-24 NOTE — Transfer of Care (Signed)
Immediate Anesthesia Transfer of Care Note  Patient: Marcus CrumbleRobert W Rockhill  Procedure(s) Performed: LEFT KNEE ARTHROSCOPIC IRRIGATION AND DEBRIDEMENT (Left Knee)  Patient Location: PACU  Anesthesia Type:General  Level of Consciousness: awake, drowsy and patient cooperative  Airway & Oxygen Therapy: Patient Spontanous Breathing and Patient connected to face mask oxygen  Post-op Assessment: Report given to RN and Post -op Vital signs reviewed and stable  Post vital signs: Reviewed and stable  Last Vitals:  Vitals Value Taken Time  BP    Temp    Pulse    Resp    SpO2      Last Pain:  Vitals:   10/24/17 0920  TempSrc: Oral  PainSc: 7       Patients Stated Pain Goal: 3 (10/24/17 0920)  Complications: No apparent anesthesia complications

## 2017-10-24 NOTE — Op Note (Signed)
10/24/2017  2:17 PM  PATIENT:  Marcus Mora    PRE-OPERATIVE DIAGNOSIS: Left knee traumatic arthrotomy  POST-OPERATIVE DIAGNOSIS:  Same  PROCEDURE:  LEFT KNEE ARTHROSCOPIC IRRIGATION AND DEBRIDEMENT  SURGEON:  Eulas PostJoshua P Idalis Hoelting, MD  PHYSICIAN ASSISTANT: Janace LittenBrandon Parry, OPA-C, present and scrubbed throughout the case, critical for completion in a timely fashion, and for retraction, instrumentation, and closure.  ANESTHESIA:   General  PREOPERATIVE INDICATIONS:  Marcus Mora is a  32 y.o. male with a diagnosis of traumatic arthrotomy who failed conservative measures and elected for surgical management.    The risks benefits and alternatives were discussed with the patient preoperatively including but not limited to the risks of infection, bleeding, nerve injury, cardiopulmonary complications, the need for revision surgery, among others, and the patient was willing to proceed.  ESTIMATED BLOOD LOSS: Minimal  OPERATIVE IMPLANTS: None  OPERATIVE FINDINGS: All of the intra-articular structures were normal.  There was a little bit of hemorrhage at the intrameniscal ligament, but the ligament was stable in the medial meniscus and lateral meniscus were stable.  The portals that he had made with the knife actually did communicate into the joint, and I could find the tract that entered the joint through the synovium.  The past the patella tendon was intact.  The medial and lateral compartments were normal, the patellofemoral joint was normal, no gross contamination.  OPERATIVE PROCEDURE: The patient was brought to the operating room and placed in supine position.  General anesthesia was administered.  IV antibiotics were given.  The left lower extremity was prepped and draped in usual sterile fashion.  Examination demonstrated intact ligamentous architecture to varus valgus and Lachman maneuver.  Diagnostic arthroscopy was carried out using standard portals.  I placed a probe through the  traumatic portal that he had already created, and found the entrance point into the joint which was just above the inner meniscal ligament on the medial side.  The meniscus itself was intact.  I did not find any chondral damage.  The ACL was intact.  I irrigated a total of 6 L through the joint in all of the compartments using a shaver as well as an outflow cannula.  The portals were closed with Monocryl, the traumatic wound was also irrigated copiously with about 500 mL of fluid, and the traumatic portal left open for drainage.  The knee was dressed with sterile gauze and he was awakened and returned to the PACU in stable and satisfactory condition.  There were no complications and he tolerated the procedure well.

## 2017-10-24 NOTE — Discharge Instructions (Addendum)
As discussed, come to the [facility] short stay area in the morning between 8 and 9 AM to see Dr. Dion SaucierLandau for further treatment of your knee.  Nothing to eat or drink tonight.  You may use the crutches as needed for weightbearing.  Keep the knee bandaged

## 2017-10-24 NOTE — Anesthesia Postprocedure Evaluation (Signed)
Anesthesia Post Note  Patient: Juanda CrumbleRobert W [place]verstick  Procedure(s) Performed: LEFT KNEE ARTHROSCOPIC IRRIGATION AND DEBRIDEMENT (Left Knee)     Patient location during evaluation: PACU Anesthesia Type: General Level of consciousness: awake and alert Pain management: pain level controlled Vital Signs Assessment: post-procedure vital signs reviewed and stable Respiratory status: spontaneous breathing, nonlabored ventilation, respiratory function stable and patient connected to nasal cannula oxygen Cardiovascular status: blood pressure returned to baseline and stable Postop Assessment: no apparent nausea or vomiting Anesthetic complications: no    Last Vitals:  Vitals:   10/24/17 1545 10/24/17 1612  BP: 121/72 138/78  Pulse: (!) 41 (!) 42  Resp: 12 16  Temp:  (!) 36.4 C  SpO2: 99% 100%    Last Pain:  Vitals:   10/24/17 1612  TempSrc: Axillary  PainSc: 5                  Rama Mcclintock S

## 2017-10-24 NOTE — Anesthesia Procedure Notes (Signed)
Procedure Name: LMA Insertion Date/Time: 10/24/2017 1:12 PM Performed by: Tensed DesanctisLinka, Soraiya Ahner L, CRNA Pre-anesthesia Checklist: Patient identified, Emergency Drugs available, Suction available, Patient being monitored and Timeout performed Patient Re-evaluated:Patient Re-evaluated prior to induction Oxygen Delivery Method: Circle system utilized Preoxygenation: Pre-oxygenation with 100% oxygen Induction Type: IV induction Ventilation: Mask ventilation without difficulty LMA: LMA inserted LMA Size: 5.0 Number of attempts: 1 Airway Equipment and Method: Bite block Placement Confirmation: positive ETCO2 Tube secured with: Tape Dental Injury: Teeth and Oropharynx as per pre-operative assessment

## 2017-10-24 NOTE — Progress Notes (Signed)
Discussed case with ER provider.  Patient with subacute traumatic arthrotomy, with air within the joint seen on plain x-ray, injury was over 24 hours old.  Recommend antibiotics, n.p.o., follow[facility] at [facility] tomorrow morning, I can evaluate his knee in short stay to determine if surgical washout is indicated.  I am operating all day tomorrow, and will see him in between cases.  I will post him provisionally for a knee arthroscopy and washout of traumatic arthrotomy for tomorrow.  Eulas PostJoshua P Prakash Kimberling, MD

## 2017-10-24 NOTE — ED Provider Notes (Signed)
[facility] EMERGENCY DEPARTMENT Provider Note   CSN: [id number] Arrival date & time: 10/23/17  2141     History   Chief Complaint Chief Complaint  Patient presents with  . Knee Pain    HPI Marcus Mora is a 32 y.o. male.  HPI   Marcus Mora is a 32 y.o. male who presents to the Emergency Department complaining of left knee pain and a laceration.  He was using a knife to cut a piece of metal siding when the knife slipped causing a stab injury to the left knee.  Injury occurred more than 24 hours prior to arrival.  He describes pain associated with weightbearing and movement of the knee with a feeling of "crunching and fullness" to his knee.  He applied some triple antibiotic ointment and bandaged the wound.  He denies fever, chills, redness or red streaking from the wound.  No extremity numbness.  TD is up-to-date.  He takes Suboxone daily with last dose approximately 12 hours prior to arrival.    Past Medical History:  Diagnosis Date  . Asthma   . Epidural hematoma (HCC) 03/04/14  . Headache    constant since head injury 03/04/2014  . History of electroencephalogram 02/2014   normal  . Temporal bone fracture (HCC) 03/04/14   Right    Patient Active Problem List   Diagnosis Date Noted  . Post-traumatic headache 07/23/2014  . 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 BPPV (benign paroxysmal positional vertigo) 07/23/2014  . Traumatic epidural hematoma [facility])     Past Surgical History:  Procedure Laterality Date  . CRANIOTOMY N/A 03/04/2014   Procedure: CRANIOTOMY HEMATOMA EVACUATION EPIDURAL;  Surgeon: Temple Pacini, MD;  Location: [facility];  Service: Neurosurgery;  Laterality: N/A;  . hernia    . HERNIA REPAIR        Home Medications    Prior to Admission medications   Medication Sig Start Date End Date Taking? Authorizing Provider  divalproex (DEPAKOTE) 500 MG DR tablet Take 500 mg by mouth 2 (two) times daily. 08/24/17  Yes [provider]    ibuprofen (ADVIL,MOTRIN) 200 MG tablet Take 200 mg by mouth every 6 (six) hours as needed for mild pain or moderate pain.   Yes [provider]  naproxen sodium (ALEVE) 220 MG tablet Take 660 mg by mouth daily as needed (for pain).   Yes [provider]  SUBOXONE 8-2 MG FILM DISSOLVE 1 FILM UNDER THE TONGUE 3 TIMES DAILY 10/19/17  Yes [provider]    Family History Family History  Problem Relation Age of Onset  . Thyroid disease Mother     Social History Social History   Tobacco Use  . Smoking status: Never Smoker  . Smokeless tobacco: Never Used  Substance Use Topics  . Alcohol use: No    Comment: denies use 03/12/12  . Drug use: No     Allergies   Hydrocodone   Review of Systems Review of Systems  Constitutional: Negative for chills and fever.  Gastrointestinal: Negative for nausea and vomiting.  Musculoskeletal: Positive for arthralgias (left knee pain). Negative for joint swelling.  Skin: Positive for wound. Negative for color change.       Laceration left knee  Neurological: Negative for weakness and numbness.  All other systems reviewed and are negative.    Physical Exam Updated Vital Signs BP 122/61   Pulse 75   Temp 97.8 F (36.6 C) (Oral)   Resp 18   Ht  5\' 10"  (1.778 m)   Wt 65 kg (143 lb 5 oz)   SpO2 98%   BMI 20.56 kg/m   Physical Exam  Constitutional: He appears well-developed and well-nourished. No distress.  HENT:  Head: Atraumatic.  Cardiovascular: Normal rate, regular rhythm and intact distal pulses.  Pulmonary/Chest: Effort normal and breath sounds normal. No respiratory distress.  Musculoskeletal: He exhibits tenderness. He exhibits no edema or deformity.  1 cm laceration to the medial aspect of distal knee.  Patella crepitus and pain with ROM.  Patellar tendon appears intact, no step off deformity.  No edema or surrounding erythema.     Neurological: He is alert. No sensory deficit.  Skin: Skin is warm.  Capillary refill takes less than 2 seconds.  Nursing note and vitals reviewed.    ED Treatments / Results  Labs (all labs ordered are listed, but only abnormal results are displayed) Labs Reviewed - No data to display  EKG None  Radiology Dg Knee Complete 4 Views Left  Result Date: 10/23/2017 CLINICAL DATA:  Laceration to left knee with knife while cutting siding yesterday. Question foreign body. EXAM: LEFT KNEE - COMPLETE 4+ VIEW COMPARISON:  None. FINDINGS: No fracture or dislocation. No radiopaque foreign body. There is scattered air in the soft tissues as well as intra-articular air. Small suprapatellar joint effusion. No bony destructive change. IMPRESSION: Soft tissue and intra-articular air consistent with laceration extending into the joint space. No radiopaque foreign body or acute osseous abnormality. Electronically Signed   By: Rubye OaksMelanie  Ehinger M.D.   On: 10/23/2017 22:38    Procedures Procedures (including critical care time)  Medications Ordered in ED Medications  amoxicillin-clavulanate (AUGMENTIN) 875-125 MG per tablet 1 tablet (has no administration in time range)  oxyCODONE-acetaminophen (PERCOCET/ROXICET) 5-325 MG per tablet 2 tablet (has no administration in time range)     Initial Impression / Assessment and Plan / ED Course  I have reviewed the triage vital signs and the nursing notes.  Pertinent labs & imaging results that were available during my care of the patient were reviewed by me and considered in my medical decision making (see chart for details).     Patient with penetrating joint injury.  Small laceration of the left knee without clinical findings to suggest infection.  Neurovascularly intact.  Tdap up-to-date  0020  Consulted Dr. Dion SaucierLandau and discussed findings.  He recommends to have pt come to [facility] short stay in the morning (Tuesday) between 8-9 am,  remain NPO tonight  Wound cleaned and bandaged.  Knee immobilizer applied and crutches  given. Initial dose of antibiotics given here, rx written. Agrees to tx plan and verbalizes understanding.    Final Clinical Impressions(s) / ED Diagnoses   Final diagnoses:  Laceration of left knee with complication, initial encounter    ED Discharge Orders    None       Pauline Ausriplett, Duru Reiger, PA-C 10/24/17 0106    Linwood DibblesKnapp, Jon, MD 10/24/17 2223

## 2017-10-25 ENCOUNTER — Encounter (HOSPITAL_BASED_OUTPATIENT_CLINIC_OR_DEPARTMENT_OTHER): Payer: Self-pay | Admitting: Orthopedic Surgery

## 2018-01-09 ENCOUNTER — Emergency Department (HOSPITAL_COMMUNITY)
Admission: EM | Admit: 2018-01-09 | Discharge: 2018-01-09 | Disposition: A | Discharge status: Home / Self Care | Payer: Medicaid Other | Attending: Emergency Medicine | Admitting: Emergency Medicine

## 2018-01-09 ENCOUNTER — Encounter (HOSPITAL_COMMUNITY): Payer: Self-pay | Admitting: *Deleted

## 2018-01-09 ENCOUNTER — Emergency Department (HOSPITAL_COMMUNITY): Payer: Medicaid Other

## 2018-01-09 DIAGNOSIS — S299XXA Unspecified injury of thorax, initial encounter: Secondary | ICD-10-CM | POA: Diagnosis present

## 2018-01-09 DIAGNOSIS — W268XXA Contact with other sharp object(s), not elsewhere classified, initial encounter: Secondary | ICD-10-CM | POA: Diagnosis not present

## 2018-01-09 DIAGNOSIS — Z79899 Other long term (current) drug therapy: Secondary | ICD-10-CM | POA: Insufficient documentation

## 2018-01-09 DIAGNOSIS — Y999 Unspecified external cause status: Secondary | ICD-10-CM | POA: Diagnosis not present

## 2018-01-09 DIAGNOSIS — Y93G9 Activity, other involving cooking and grilling: Secondary | ICD-10-CM | POA: Insufficient documentation

## 2018-01-09 DIAGNOSIS — Y929 Unspecified place or not applicable: Secondary | ICD-10-CM | POA: Diagnosis not present

## 2018-01-09 DIAGNOSIS — J45909 Unspecified asthma, uncomplicated: Secondary | ICD-10-CM | POA: Diagnosis not present

## 2018-01-09 DIAGNOSIS — S21111A Laceration without foreign body of right front wall of thorax without penetration into thoracic cavity, initial encounter: Secondary | ICD-10-CM | POA: Diagnosis not present

## 2018-01-09 MED ORDER — LIDOCAINE HCL (PF) 1 % IJ SOLN
INTRAMUSCULAR | Status: AC
  Administered 2018-01-09: 2 mL via INTRADERMAL
  Filled 2018-01-09: qty 2

## 2018-01-09 MED ORDER — LIDOCAINE HCL (PF) 1 % IJ SOLN
2.0000 mL | Freq: Once | INTRAMUSCULAR | Status: AC
  Administered 2018-01-09: 2 mL via INTRADERMAL

## 2018-01-09 NOTE — ED Notes (Signed)
Pt refused Tetanus

## 2018-01-09 NOTE — ED Provider Notes (Signed)
[facility] EMERGENCY DEPARTMENT Provider Note   CSN: [id number] Arrival date & time: 01/09/18  9562     History   Chief Complaint Chief Complaint  Patient presents with  . Laceration    HPI Marcus Mora is a 32 y.o. male.  Patient is a 32 year old male who presents to the emergency department with a laceration to the lower chest.  The patient states that he was cooking s'more's with his children.  He was sharpening a stick to use for the marshmallows.  1 of the children was too close to the fire or throwing something on the fire and took his attention.  And he accidentally cut the lower chest.  This happened around 6:00 PM today.  The patient states that his last tetanus was probably 2012.  Patient denies being on any anticoagulation medications.  The history is provided by the patient.  Laceration      Past Medical History:  Diagnosis Date  . Asthma   . Epidural hematoma (HCC) 03/04/14  . Headache    constant since head injury 03/04/2014  . History of electroencephalogram 02/2014   normal  . Laceration of knee, left, complicated, initial encounter 10/24/2017  . Temporal bone fracture (HCC) 03/04/14   Right    Patient Active Problem List   Diagnosis Date Noted  . Laceration of knee, left, complicated, initial encounter, traumatic arthrotomy 10/24/2017  . Post-traumatic headache 07/23/2014  . 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 BPPV (benign paroxysmal positional vertigo) 07/23/2014  . Traumatic epidural hematoma [facility])     Past Surgical History:  Procedure Laterality Date  . CRANIOTOMY N/A 03/04/2014   Procedure: CRANIOTOMY HEMATOMA EVACUATION EPIDURAL;  Surgeon: Temple Pacini, MD;  Location: [facility];  Service: Neurosurgery;  Laterality: N/A;  . hernia    . HERNIA REPAIR    . KNEE ARTHROSCOPY Left 10/24/2017   Procedure: LEFT KNEE ARTHROSCOPIC IRRIGATION AND DEBRIDEMENT;  Surgeon: Teryl Lucy, MD;  Location: [facility];  Service:  Orthopedics;  Laterality: Left;        Home Medications    Prior to Admission medications   Medication Sig Start Date End Date Taking? Authorizing Provider  buprenorphine-naloxone (SUBOXONE) 8-2 mg SUBL SL tablet Place 1 tablet under the tongue 3 (three) times daily.   Yes [provider]  divalproex (DEPAKOTE) 500 MG DR tablet Take 500 mg by mouth 2 (two) times daily. 08/24/17  Yes [provider]  amoxicillin-clavulanate (AUGMENTIN) 875-125 MG tablet Take 1 tablet by mouth every 12 (twelve) hours. 10/24/17   Triplett, Tammy, PA-C  ibuprofen (ADVIL,MOTRIN) 200 MG tablet Take 200 mg by mouth every 6 (six) hours as needed for mild pain or moderate pain.    [provider]  ondansetron (ZOFRAN) 4 MG tablet Take 1 tablet (4 mg total) by mouth every 8 (eight) hours as needed for nausea or vomiting. 10/24/17   Teryl Lucy, MD  oxyCODONE (ROXICODONE) 5 MG immediate release tablet Take 1 tablet (5 mg total) by mouth every 4 (four) hours as needed for severe pain. 10/24/17   Teryl Lucy, MD  sennosides-docusate sodium (SENOKOT-S) 8.6-50 MG tablet Take 2 tablets by mouth daily. 10/24/17   Teryl Lucy, MD    Family History Family History  Problem Relation Age of Onset  . Thyroid disease Mother     Social History Social History   Tobacco Use  . Smoking status: Never Smoker  . Smokeless tobacco: Never Used  Substance Use Topics  . Alcohol use:  No    Comment: denies use 03/12/12  . Drug use: No     Allergies   Hydrocodone   Review of Systems Review of Systems  Constitutional: Negative for activity change.       All ROS Neg except as noted in HPI  HENT: Negative for nosebleeds.   Eyes: Negative for photophobia and discharge.  Respiratory: Negative for cough, shortness of breath and wheezing.   Cardiovascular: Negative for chest pain and palpitations.  Gastrointestinal: Negative for abdominal pain and blood in stool.  Genitourinary: Negative for  dysuria, frequency and hematuria.  Musculoskeletal: Negative for arthralgias, back pain and neck pain.  Skin: Negative.   Neurological: Negative for dizziness, seizures and speech difficulty.  Psychiatric/Behavioral: Negative for confusion and hallucinations.     Physical Exam Updated Vital Signs BP (!) 143/97   Pulse 86   Temp (!) 97.5 F (36.4 C) (Oral)   Resp 16   Ht 5\' 10"  (1.778 m)   Wt 63.5 kg   SpO2 100%   BMI 20.09 kg/m   Physical Exam  Constitutional: He is oriented to person, place, and time. He appears well-developed and well-nourished.  Non-toxic appearance.  HENT:  Head: Normocephalic.  Right Ear: Tympanic membrane and external ear normal.  Left Ear: Tympanic membrane and external ear normal.  Eyes: Pupils are equal, round, and reactive to light. EOM and lids are normal.  Neck: Normal range of motion. Neck supple. Carotid bruit is not present.  Cardiovascular: Normal rate, regular rhythm, normal heart sounds, intact distal pulses and normal pulses.  Pulmonary/Chest: Breath sounds normal. No tachypnea. No respiratory distress. He exhibits tenderness and laceration. He exhibits no crepitus.  Patient speaks in complete sentences.  There is no Tachypnea, no crepitus appreciated.  There is symmetrical rise and fall of the chest without problem.    Abdominal: Soft. Bowel sounds are normal. There is no tenderness. There is no guarding.  Musculoskeletal: Normal range of motion.  Lymphadenopathy:       Head (right side): No submandibular adenopathy present.       Head (left side): No submandibular adenopathy present.    He has no cervical adenopathy.  Neurological: He is alert and oriented to person, place, and time. He has normal strength. No cranial nerve deficit or sensory deficit.  Skin: Skin is warm and dry.  Psychiatric: He has a normal mood and affect. His speech is normal.  Nursing note and vitals reviewed.    ED Treatments / Results  Labs (all labs  ordered are listed, but only abnormal results are displayed) Labs Reviewed - No data to display  EKG None  Radiology No results found.  Procedures .Marland KitchenLaceration Repair Date/Time: 01/09/2018 1:01 AM Performed by: Ivery Quale, PA-C Authorized by: Ivery Quale, PA-C   Consent:    Consent obtained:  Verbal   Consent given by:  Patient   Risks discussed:  Infection, pain, poor cosmetic result and poor wound healing Universal protocol:    Procedure explained and questions answered to patient or proxy's satisfaction: yes     Immediately prior to procedure, a time out was called: yes     Patient identity confirmed:  Arm band Anesthesia (see MAR for exact dosages):    Anesthesia method:  Local infiltration   Local anesthetic:  Lidocaine 1% w/o epi Laceration details:    Location:  Trunk   Trunk location:  R chest   Length (cm):  1.1 Repair type:    Repair type:  Simple Pre-procedure  details:    Preparation:  Patient was prepped and draped in usual sterile fashion Exploration:    Hemostasis achieved with:  Direct pressure Treatment:    Area cleansed with:  Soap and water   Amount of cleaning:  Standard   Irrigation solution:  Tap water   Irrigation method:  Tap Skin repair:    Repair method:  Staples   Number of staples:  3 Post-procedure details:    Dressing:  Sterile dressing   Patient tolerance of procedure:  Tolerated well, no immediate complications   (including critical care time)  Medications Ordered in ED Medications  lidocaine (PF) (XYLOCAINE) 1 % injection 2 mL (2 mLs Intradermal Given 01/09/18 0050)     Initial Impression / Assessment and Plan / ED Course  I have reviewed the triage vital signs and the nursing notes.  Pertinent labs & imaging results that were available during my care of the patient were reviewed by me and considered in my medical decision making (see chart for details).       Final Clinical Impressions(s) / ED  Diagnoses MDM  Vital signs reviewed.  Pulse oximetry is 100% on room air.  Within normal limits by my interpretation. Patient sustained a laceration to the lower chest anterior area.  There is no crepitus appreciated.  The patient speaks in complete sentences without problem.  Chest x-ray is negative for acute changes.  Was repaired with 3 staples.  I advised the patient have those removed in 7 or 8 days.  The patient is in agreement with this plan.   Final diagnoses:  Laceration of chest wall, right, initial encounter    ED Discharge Orders    None       Ivery Quale, PA-C 01/10/18 1051    Devoria Albe, MD 01/12/18 2259

## 2018-01-09 NOTE — ED Triage Notes (Signed)
Pt was at a [employer] smores with his kids when the knife slipped cutting his right upper abd area.

## 2018-01-09 NOTE — Discharge Instructions (Addendum)
Your wound was repaired with 3 staples.  Please have these taken out in 7 or 8 days.  Please keep the wound clean and dry.  Your chest x-ray is negative for any acute changes or problems.  Your tetanus was updated tonight.  Please make a notation in your medical records.

## 2018-01-17 ENCOUNTER — Emergency Department (HOSPITAL_COMMUNITY)
Admission: EM | Admit: 2018-01-17 | Discharge: 2018-01-18 | Disposition: A | Discharge status: Home / Self Care | Payer: Medicaid Other | Attending: Emergency Medicine | Admitting: Emergency Medicine

## 2018-01-17 ENCOUNTER — Other Ambulatory Visit: Payer: Self-pay

## 2018-01-17 ENCOUNTER — Encounter (HOSPITAL_COMMUNITY): Payer: Self-pay | Admitting: Emergency Medicine

## 2018-01-17 DIAGNOSIS — Z4802 Encounter for removal of sutures: Secondary | ICD-10-CM | POA: Diagnosis not present

## 2018-01-17 NOTE — ED Triage Notes (Signed)
Patient needs staples removed from his chest.

## 2018-01-17 NOTE — ED Provider Notes (Signed)
[facility] EMERGENCY DEPARTMENT Provider Note   CSN: [id number] Arrival date & time: 01/17/18  2154     History   Chief Complaint No chief complaint on file.   HPI Marcus Mora is a 32 y.o. male presenting for staple removal from his chest wall, injury incurred 8 days ago by a stick sharpened for marshmallow roasting.  He denies any problems with the wound, drainage, swelling or pain.  Staples were placed here.  The history is provided by the patient.    Past Medical History:  Diagnosis Date  . Asthma   . Epidural hematoma (HCC) 03/04/14  . Headache    constant since head injury 03/04/2014  . History of electroencephalogram 02/2014   normal  . Laceration of knee, left, complicated, initial encounter 10/24/2017  . Temporal bone fracture (HCC) 03/04/14   Right    Patient Active Problem List   Diagnosis Date Noted  . Laceration of knee, left, complicated, initial encounter, traumatic arthrotomy 10/24/2017  . Post-traumatic headache 07/23/2014  . 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 BPPV (benign paroxysmal positional vertigo) 07/23/2014  . Traumatic epidural hematoma [facility])     Past Surgical History:  Procedure Laterality Date  . CRANIOTOMY N/A 03/04/2014   Procedure: CRANIOTOMY HEMATOMA EVACUATION EPIDURAL;  Surgeon: Temple Pacini, MD;  Location: [facility];  Service: Neurosurgery;  Laterality: N/A;  . hernia    . HERNIA REPAIR    . KNEE ARTHROSCOPY Left 10/24/2017   Procedure: LEFT KNEE ARTHROSCOPIC IRRIGATION AND DEBRIDEMENT;  Surgeon: Teryl Lucy, MD;  Location: [facility];  Service: Orthopedics;  Laterality: Left;        Home Medications    Prior to Admission medications   Medication Sig Start Date End Date Taking? Authorizing Provider  buprenorphine-naloxone (SUBOXONE) 8-2 mg SUBL SL tablet Place 1 tablet under the tongue 3 (three) times daily.   Yes [provider]  divalproex (DEPAKOTE) 500 MG DR tablet Take 500 mg by mouth 2  (two) times daily. 08/24/17  Yes [provider]  ibuprofen (ADVIL,MOTRIN) 200 MG tablet Take 200 mg by mouth every 6 (six) hours as needed for mild pain or moderate pain.   Yes [provider]  ondansetron (ZOFRAN) 4 MG tablet Take 1 tablet (4 mg total) by mouth every 8 (eight) hours as needed for nausea or vomiting. Patient not taking: Reported on 01/17/2018 10/24/17   Teryl Lucy, MD  oxyCODONE (ROXICODONE) 5 MG immediate release tablet Take 1 tablet (5 mg total) by mouth every 4 (four) hours as needed for severe pain. Patient not taking: Reported on 01/17/2018 10/24/17   Teryl Lucy, MD  sennosides-docusate sodium (SENOKOT-S) 8.6-50 MG tablet Take 2 tablets by mouth daily. Patient not taking: Reported on 01/17/2018 10/24/17   Teryl Lucy, MD    Family History Family History  Problem Relation Age of Onset  . Thyroid disease Mother     Social History Social History   Tobacco Use  . Smoking status: Never Smoker  . Smokeless tobacco: Never Used  Substance Use Topics  . Alcohol use: No    Comment: denies use 03/12/12  . Drug use: No     Allergies   Hydrocodone   Review of Systems Review of Systems  Constitutional: Negative for chills and fever.  Skin: Positive for wound.  All other systems reviewed and are negative.    Physical Exam Updated Vital Signs BP (!) 126/59 (BP Location: Right Arm)   Pulse (!) 105   Temp  97.8 F (36.6 C) (Oral)   Resp 18   Ht 5\' 10"  (1.778 m)   Wt 64 kg   SpO2 100%   BMI 20.23 kg/m   Physical Exam  Constitutional: He is oriented to person, place, and time. He appears well-developed and well-nourished.  HENT:  Head: Normocephalic.  Cardiovascular: Normal rate.  Pulmonary/Chest: Effort normal.  Neurological: He is alert and oriented to person, place, and time. No sensory deficit.  Skin: Laceration noted.  Small well healed laceration right lower anterior chest wall.     ED Treatments / Results  Labs (all  labs ordered are listed, but only abnormal results are displayed) Labs Reviewed - No data to display  EKG None  Radiology No results found.  Procedures Procedures (including critical care time)  SUTURE REMOVAL Performed by: Burgess Amor  Consent: Verbal consent obtained. Patient identity confirmed: provided demographic data Time out: Immediately prior to procedure a "time out" was called to verify the correct patient, procedure, equipment, support staff and site/side marked as required.  Location details: right chest  Wound Appearance: clean  Sutures/Staples Removed: 3  Facility: sutures placed in this facility Patient tolerance: Patient tolerated the procedure well with no immediate complications.     Medications Ordered in ED Medications - No data to display   Initial Impression / Assessment and Plan / ED Course  I have reviewed the triage vital signs and the nursing notes.  Pertinent labs & imaging results that were available during my care of the patient were reviewed by me and considered in my medical decision making (see chart for details).     Prn f/u anticipated.  Final Clinical Impressions(s) / ED Diagnoses   Final diagnoses:  Encounter for staple removal    ED Discharge Orders    None       Victoriano Lain 01/17/18 2327    Terrilee Files, MD 01/18/18 [phone]

## 2018-05-25 ENCOUNTER — Encounter (HOSPITAL_COMMUNITY): Payer: Self-pay

## 2018-05-25 ENCOUNTER — Emergency Department (HOSPITAL_COMMUNITY)
Admission: EM | Admit: 2018-05-25 | Discharge: 2018-05-25 | Disposition: A | Discharge status: Home / Self Care | Payer: Medicaid Other | Attending: Emergency Medicine | Admitting: Emergency Medicine

## 2018-05-25 ENCOUNTER — Other Ambulatory Visit: Payer: Self-pay

## 2018-05-25 DIAGNOSIS — F329 Major depressive disorder, single episode, unspecified: Secondary | ICD-10-CM | POA: Insufficient documentation

## 2018-05-25 DIAGNOSIS — Y9389 Activity, other specified: Secondary | ICD-10-CM | POA: Diagnosis not present

## 2018-05-25 DIAGNOSIS — Z79899 Other long term (current) drug therapy: Secondary | ICD-10-CM | POA: Diagnosis not present

## 2018-05-25 DIAGNOSIS — S6992XA Unspecified injury of left wrist, hand and finger(s), initial encounter: Secondary | ICD-10-CM | POA: Diagnosis present

## 2018-05-25 DIAGNOSIS — Y929 Unspecified place or not applicable: Secondary | ICD-10-CM | POA: Insufficient documentation

## 2018-05-25 DIAGNOSIS — S61412A Laceration without foreign body of left hand, initial encounter: Secondary | ICD-10-CM | POA: Insufficient documentation

## 2018-05-25 DIAGNOSIS — J45909 Unspecified asthma, uncomplicated: Secondary | ICD-10-CM | POA: Diagnosis not present

## 2018-05-25 DIAGNOSIS — W260XXA Contact with knife, initial encounter: Secondary | ICD-10-CM | POA: Insufficient documentation

## 2018-05-25 DIAGNOSIS — Y998 Other external cause status: Secondary | ICD-10-CM | POA: Diagnosis not present

## 2018-05-25 DIAGNOSIS — Z23 Encounter for immunization: Secondary | ICD-10-CM | POA: Diagnosis not present

## 2018-05-25 MED ORDER — AMOXICILLIN-POT CLAVULANATE 875-125 MG PO TABS
1.0000 | ORAL_TABLET | Freq: Once | ORAL | Status: AC
  Administered 2018-05-25: 1 via ORAL
  Filled 2018-05-25: qty 1

## 2018-05-25 MED ORDER — AMOXICILLIN-POT CLAVULANATE 875-125 MG PO TABS: 1.0000 | ORAL_TABLET | Freq: Two times a day (BID) | ORAL | 0 refills | Status: DC

## 2018-05-25 MED ORDER — TETANUS-DIPHTH-ACELL PERTUSSIS 5-2.5-18.5 LF-MCG/0.5 IM SUSP
0.5000 mL | Freq: Once | INTRAMUSCULAR | Status: AC
  Administered 2018-05-25: 0.5 mL via INTRAMUSCULAR
  Filled 2018-05-25: qty 0.5

## 2018-05-25 NOTE — ED Provider Notes (Signed)
[facility] EMERGENCY DEPARTMENT Provider Note   CSN: [id number] Arrival date & time: 05/25/18  0247    History   Chief Complaint Chief Complaint  Patient presents with  . Laceration    L Thumb    HPI Marcus Mora is a 33 y.o. male.     Patient states he cut his left hand with a knife while cutting a piece of plastic pipe 2 nights ago on the evening of February 26.  He has been using antibiotic ointment at home.  He states it is no longer bleeding.  There is no focal weakness, numbness or tingling.  Patient states he cannot come in sooner because he had a work yesterday. States his last tetanus was in 2015.  The history is provided by the patient.  Laceration  Associated symptoms: no fever     Past Medical History:  Diagnosis Date  . Asthma   . Epidural hematoma (HCC) 03/04/14  . Headache    constant since head injury 03/04/2014  . History of electroencephalogram 02/2014   normal  . Laceration of knee, left, complicated, initial encounter 10/24/2017  . Temporal bone fracture (HCC) 03/04/14   Right    Patient Active Problem List   Diagnosis Date Noted  . Laceration of knee, left, complicated, initial encounter, traumatic arthrotomy 10/24/2017  . Post-traumatic headache 07/23/2014  . 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 BPPV (benign paroxysmal positional vertigo) 07/23/2014  . Traumatic epidural hematoma [facility])     Past Surgical History:  Procedure Laterality Date  . CRANIOTOMY N/A 03/04/2014   Procedure: CRANIOTOMY HEMATOMA EVACUATION EPIDURAL;  Surgeon: Temple Pacini, MD;  Location: [facility];  Service: Neurosurgery;  Laterality: N/A;  . hernia    . HERNIA REPAIR    . KNEE ARTHROSCOPY Left 10/24/2017   Procedure: LEFT KNEE ARTHROSCOPIC IRRIGATION AND DEBRIDEMENT;  Surgeon: Teryl Lucy, MD;  Location: [facility];  Service: Orthopedics;  Laterality: Left;        Home Medications    Prior to Admission medications   Medication Sig Start  Date End Date Taking? Authorizing Provider  buprenorphine-naloxone (SUBOXONE) 8-2 mg SUBL SL tablet Place 1 tablet under the tongue 3 (three) times daily.    [provider]  divalproex (DEPAKOTE) 500 MG DR tablet Take 500 mg by mouth 2 (two) times daily. 08/24/17   [provider]  ibuprofen (ADVIL,MOTRIN) 200 MG tablet Take 200 mg by mouth every 6 (six) hours as needed for mild pain or moderate pain.    [provider]  ondansetron (ZOFRAN) 4 MG tablet Take 1 tablet (4 mg total) by mouth every 8 (eight) hours as needed for nausea or vomiting. Patient not taking: Reported on 01/17/2018 10/24/17   Teryl Lucy, MD  oxyCODONE (ROXICODONE) 5 MG immediate release tablet Take 1 tablet (5 mg total) by mouth every 4 (four) hours as needed for severe pain. Patient not taking: Reported on 01/17/2018 10/24/17   Teryl Lucy, MD  sennosides-docusate sodium (SENOKOT-S) 8.6-50 MG tablet Take 2 tablets by mouth daily. Patient not taking: Reported on 01/17/2018 10/24/17   Teryl Lucy, MD    Family History Family History  Problem Relation Age of Onset  . Thyroid disease Mother     Social History Social History   Tobacco Use  . Smoking status: Never Smoker  . Smokeless tobacco: Never Used  Substance Use Topics  . Alcohol use: No    Comment: denies use 03/12/12  . Drug use: No  Allergies   Hydrocodone   Review of Systems Review of Systems  Constitutional: Negative for activity change, appetite change and fever.  HENT: Negative for congestion.   Eyes: Negative for visual disturbance.  Respiratory: Negative for cough, chest tightness and shortness of breath.   Cardiovascular: Negative for chest pain.  Gastrointestinal: Negative for abdominal pain, nausea and vomiting.  Genitourinary: Negative for dysuria, hematuria and testicular pain.  Musculoskeletal: Negative for arthralgias and myalgias.  Skin: Positive for wound.  Neurological: Negative for dizziness,  weakness and headaches.   all other systems are negative except as noted in the HPI and PMH.     Physical Exam Updated Vital Signs BP 125/89 (BP Location: Right Arm)   Pulse (!) 113   Temp (!) 97.5 F (36.4 C) (Oral)   Resp 18   Ht 5\' 10"  (1.778 m)   Wt 62.6 kg   SpO2 99%   BMI 19.80 kg/m   Physical Exam Vitals signs and nursing note reviewed.  Constitutional:      General: He is not in acute distress.    Appearance: He is well-developed.  HENT:     Head: Normocephalic and atraumatic.     Mouth/Throat:     Pharynx: No oropharyngeal exudate.  Eyes:     Conjunctiva/sclera: Conjunctivae normal.     Pupils: Pupils are equal, round, and reactive to light.  Neck:     Musculoskeletal: Normal range of motion and neck supple.     Comments: No meningismus. Cardiovascular:     Rate and Rhythm: Normal rate and regular rhythm.     Heart sounds: Normal heart sounds. No murmur.  Pulmonary:     Effort: Pulmonary effort is normal. No respiratory distress.     Breath sounds: Normal breath sounds.  Abdominal:     Palpations: Abdomen is soft.     Tenderness: There is no abdominal tenderness. There is no guarding or rebound.  Musculoskeletal: Normal range of motion.        General: No tenderness.     Comments: 2 cm superficial laceration to the left thenar eminence. There is no muscle or tendon exposure. Flexion, extension, opposition of the left thumb is intact. Full range of motion of other fingers and wrist.  Intact radial pulse.  Skin:    General: Skin is warm.     Capillary Refill: Capillary refill takes less than 2 seconds.  Neurological:     General: No focal deficit present.     Mental Status: He is alert and oriented to person, place, and time. Mental status is at baseline.     Cranial Nerves: No cranial nerve deficit.     Motor: No abnormal muscle tone.     Coordination: Coordination normal.     Comments:  5/5 strength throughout. CN 2-12 intact.Equal grip strength.     Psychiatric:        Behavior: Behavior normal.      ED Treatments / Results  Labs (all labs ordered are listed, but only abnormal results are displayed) Labs Reviewed - No data to display  EKG None  Radiology No results found.  Procedures Procedures (including critical care time)  Medications Ordered in ED Medications  Tdap (BOOSTRIX) injection 0.5 mL (has no administration in time range)     Initial Impression / Assessment and Plan / ED Course  I have reviewed the triage vital signs and the nursing notes.  Pertinent labs & imaging results that were available during my care of the patient  were reviewed by me and considered in my medical decision making (see chart for details).       Superficial laceration of the left hand which is almost 36 hours old.  No neurovascular deficit.  Discussed with patient that wound cannot be sutured or glued at this point due to risk of infection.  Will clean and place Steri-Strips and dressing.  Will update tetanus. Will give prophylactic antibiotics.  Follow-up for wound check with PCP in 2 days.  Return precautions discussed.  Final Clinical Impressions(s) / ED Diagnoses   Final diagnoses:  Laceration of left hand, foreign body presence unspecified, initial encounter    ED Discharge Orders    None       Lameisha Schuenemann, Jeannett Senior, MD 05/25/18 [id number]

## 2018-05-25 NOTE — ED Triage Notes (Signed)
Pt cut his left thumb Wednesday cutting pipe for his washer. isnt bleeding. Been applying antibiotic ointment. Last tetanus 2015 per pt  Also would like his right thumb checked out for a knot on joint.

## 2018-05-25 NOTE — Discharge Instructions (Addendum)
Keep wound clean and dry.  The wound was not sutured today as it is almost 45 hours old and the risk of infection is too high.  Take the antibiotics as prescribed and follow-up for a wound check in 2 days.  Return to the ED with worsening pain, fever, drainage or other concerns.

## 2018-05-25 NOTE — ED Notes (Addendum)
edp to room  

## 2018-10-29 ENCOUNTER — Emergency Department (HOSPITAL_COMMUNITY)
Admission: EM | Admit: 2018-10-29 | Discharge: 2018-10-30 | Disposition: A | Discharge status: Home / Self Care | Payer: Self-pay | Attending: Emergency Medicine | Admitting: Emergency Medicine

## 2018-10-29 ENCOUNTER — Encounter (HOSPITAL_COMMUNITY): Payer: Self-pay | Admitting: *Deleted

## 2018-10-29 ENCOUNTER — Emergency Department (HOSPITAL_COMMUNITY): Payer: Self-pay

## 2018-10-29 ENCOUNTER — Other Ambulatory Visit: Payer: Self-pay

## 2018-10-29 DIAGNOSIS — R0602 Shortness of breath: Secondary | ICD-10-CM

## 2018-10-29 DIAGNOSIS — Z20828 Contact with and (suspected) exposure to other viral communicable diseases: Secondary | ICD-10-CM | POA: Insufficient documentation

## 2018-10-29 DIAGNOSIS — Z7189 Other specified counseling: Secondary | ICD-10-CM

## 2018-10-29 DIAGNOSIS — J45909 Unspecified asthma, uncomplicated: Secondary | ICD-10-CM | POA: Insufficient documentation

## 2018-10-29 DIAGNOSIS — Z79899 Other long term (current) drug therapy: Secondary | ICD-10-CM | POA: Insufficient documentation

## 2018-10-29 DIAGNOSIS — B349 Viral infection, unspecified: Secondary | ICD-10-CM | POA: Insufficient documentation

## 2018-10-29 NOTE — ED Triage Notes (Signed)
Pt c/o generalized body aches with chills, abdominal pain and weakness x 3 days

## 2018-10-30 LAB — CBC WITH DIFFERENTIAL/PLATELET
Abs Immature Granulocytes: 0.01 10*3/uL (ref 0.00–0.07)
Basophils Absolute: 0 10*3/uL (ref 0.0–0.1)
Basophils Relative: 1 %
Eosinophils Absolute: 0.3 10*3/uL (ref 0.0–0.5)
Eosinophils Relative: 5 %
HCT: 37.9 % — ABNORMAL LOW (ref 39.0–52.0)
Hemoglobin: 12 g/dL — ABNORMAL LOW (ref 13.0–17.0)
Immature Granulocytes: 0 %
Lymphocytes Relative: 34 %
Lymphs Abs: 1.6 10*3/uL (ref 0.7–4.0)
MCH: 29.9 pg (ref 26.0–34.0)
MCHC: 31.7 g/dL (ref 30.0–36.0)
MCV: 94.3 fL (ref 80.0–100.0)
Monocytes Absolute: 0.4 10*3/uL (ref 0.1–1.0)
Monocytes Relative: 7 %
Neutro Abs: 2.5 10*3/uL (ref 1.7–7.7)
Neutrophils Relative %: 53 %
Platelets: 161 10*3/uL (ref 150–400)
RBC: 4.02 MIL/uL — ABNORMAL LOW (ref 4.22–5.81)
RDW: 12 % (ref 11.5–15.5)
WBC: 4.8 10*3/uL (ref 4.0–10.5)
nRBC: 0 % (ref 0.0–0.2)

## 2018-10-30 LAB — BASIC METABOLIC PANEL
Anion gap: 3 — ABNORMAL LOW (ref 5–15)
BUN: 15 mg/dL (ref 6–20)
CO2: 30 mmol/L (ref 22–32)
Calcium: 8.6 mg/dL — ABNORMAL LOW (ref 8.9–10.3)
Chloride: 107 mmol/L (ref 98–111)
Creatinine, Ser: 0.86 mg/dL (ref 0.61–1.24)
GFR calc Af Amer: >60 mL/min (ref >60)
GFR calc non Af Amer: >60 mL/min (ref >60)
Glucose, Bld: 105 mg/dL — ABNORMAL HIGH (ref 70–99)
Potassium: 4.2 mmol/L (ref 3.5–5.1)
Sodium: 140 mmol/L (ref 135–145)

## 2018-10-30 LAB — SARS CORONAVIRUS 2 (TAT 6-24 HRS): SARS Coronavirus 2: NEGATIVE

## 2018-10-30 MED ORDER — ONDANSETRON 4 MG PO TBDP
4.0000 mg | ORAL_TABLET | Freq: Once | ORAL | Status: AC
  Administered 2018-10-30: 4 mg via ORAL
  Filled 2018-10-30: qty 1

## 2018-10-30 MED ORDER — ONDANSETRON 4 MG PO TBDP: 4.0000 mg | ORAL_TABLET | Freq: Three times a day (TID) | ORAL | 0 refills | Status: DC | PRN

## 2018-10-30 MED ORDER — NAPROXEN 500 MG PO TABS: 500.0000 mg | ORAL_TABLET | Freq: Two times a day (BID) | ORAL | 0 refills | Status: DC

## 2018-10-30 NOTE — ED Provider Notes (Signed)
[facility] EMERGENCY DEPARTMENT Provider Note   CSN: [id number] Arrival date & time: 10/29/18  2045     History   Chief Complaint Chief Complaint  Patient presents with  . Generalized Body Aches    HPI Marcus Mora is a 33 y.o. male.     HPI  This is a 33 year old male who presents with generalized body aches, abdominal pain, nausea and generalized weakness for several days.  Patient reports onset of symptoms on Saturday.  He reports fever up to 100.8.  Denies cough, shortness of breath, chest pain.  Reports chills and myalgias.  Reports that he did have some of her abdominal pain but no longer has abdominal pain.  He reports several episodes of nonbilious, nonbloody emesis.  He denies any sick contacts or COVID exposures.  Past Medical History:  Diagnosis Date  . Asthma   . Epidural hematoma (HCC) 03/04/14  . Headache    constant since head injury 03/04/2014  . History of electroencephalogram 02/2014   normal  . Laceration of knee, left, complicated, initial encounter 10/24/2017  . Temporal bone fracture (HCC) 03/04/14   Right    Patient Active Problem List   Diagnosis Date Noted  . Laceration of knee, left, complicated, initial encounter, traumatic arthrotomy 10/24/2017  . Post-traumatic headache 07/23/2014  . 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 BPPV (benign paroxysmal positional vertigo) 07/23/2014  . Traumatic epidural hematoma [facility](HCC)     Past Surgical History:  Procedure Laterality Date  . CRANIOTOMY N/A 03/04/2014   Procedure: CRANIOTOMY HEMATOMA EVACUATION EPIDURAL;  Surgeon: Temple PaciniHenry A Pool, MD;  Location: [facility];  Service: Neurosurgery;  Laterality: N/A;  . hernia    . HERNIA REPAIR    . KNEE ARTHROSCOPY Left 10/24/2017   Procedure: LEFT KNEE ARTHROSCOPIC IRRIGATION AND DEBRIDEMENT;  Surgeon: Teryl LucyLandau, Joshua, MD;  Location: [facility];  Service: Orthopedics;  Laterality: Left;        Home Medications    Prior to Admission medications    Medication Sig Start Date End Date Taking? Authorizing Provider  amoxicillin-clavulanate (AUGMENTIN) 875-125 MG tablet Take 1 tablet by mouth every 12 (twelve) hours. 05/25/18   Rancour, Jeannett SeniorStephen, MD  buprenorphine-naloxone (SUBOXONE) 8-2 mg SUBL SL tablet Place 1 tablet under the tongue 3 (three) times daily.    [provider]  divalproex (DEPAKOTE) 500 MG DR tablet Take 500 mg by mouth 2 (two) times daily. 08/24/17   [provider]  ibuprofen (ADVIL,MOTRIN) 200 MG tablet Take 200 mg by mouth every 6 (six) hours as needed for mild pain or moderate pain.    [provider]  naproxen (NAPROSYN) 500 MG tablet Take 1 tablet (500 mg total) by mouth 2 (two) times daily. 10/30/18   Maddalynn Barnard, Mayer Maskerourtney F, MD  ondansetron (ZOFRAN ODT) 4 MG disintegrating tablet Take 1 tablet (4 mg total) by mouth every 8 (eight) hours as needed for nausea or vomiting. 10/30/18   Reneka Nebergall, Mayer Maskerourtney F, MD  ondansetron (ZOFRAN) 4 MG tablet Take 1 tablet (4 mg total) by mouth every 8 (eight) hours as needed for nausea or vomiting. Patient not taking: Reported on 01/17/2018 10/24/17   Teryl LucyLandau, Joshua, MD  oxyCODONE (ROXICODONE) 5 MG immediate release tablet Take 1 tablet (5 mg total) by mouth every 4 (four) hours as needed for severe pain. Patient not taking: Reported on 01/17/2018 10/24/17   Teryl LucyLandau, Joshua, MD  sennosides-docusate sodium (SENOKOT-S) 8.6-50 MG tablet Take 2 tablets by mouth daily. Patient not taking: Reported on 01/17/2018  10/24/17   Marchia Bond, MD    Family History Family History  Problem Relation Age of Onset  . Thyroid disease Mother     Social History Social History   Tobacco Use  . Smoking status: Never Smoker  . Smokeless tobacco: Never Used  Substance Use Topics  . Alcohol use: No    Comment: denies use 03/12/12  . Drug use: No     Allergies   Hydrocodone   Review of Systems Review of Systems  Constitutional: Positive for chills and fever.  Respiratory: Negative  for cough and shortness of breath.   Cardiovascular: Negative for chest pain.  Gastrointestinal: Positive for abdominal pain, nausea and vomiting. Negative for constipation and diarrhea.  Genitourinary: Negative for dysuria.  Musculoskeletal: Positive for myalgias. Negative for back pain.  All other systems reviewed and are negative.    Physical Exam Updated Vital Signs BP 115/67 (BP Location: Right Arm)   Pulse 69   Temp 97.8 F (36.6 C) (Oral)   Resp 18   Ht 1.803 m (5\' 11" )   Wt 68 kg   SpO2 100%   BMI 20.92 kg/m   Physical Exam Vitals signs and nursing note reviewed.  Constitutional:      Appearance: He is well-developed. He is not ill-appearing.  HENT:     Head: Normocephalic and atraumatic.     Mouth/Throat:     Mouth: Mucous membranes are moist.  Eyes:     Pupils: Pupils are equal, round, and reactive to light.  Neck:     Musculoskeletal: Neck supple.  Cardiovascular:     Rate and Rhythm: Normal rate and regular rhythm.     Heart sounds: Normal heart sounds. No murmur.  Pulmonary:     Effort: Pulmonary effort is normal. No respiratory distress.     Breath sounds: Normal breath sounds. No wheezing.  Abdominal:     General: Bowel sounds are normal.     Palpations: Abdomen is soft.     Tenderness: There is no abdominal tenderness. There is no guarding or rebound.  Musculoskeletal:     Right lower leg: No edema.     Left lower leg: No edema.  Lymphadenopathy:     Cervical: No cervical adenopathy.  Skin:    General: Skin is warm and dry.  Neurological:     Mental Status: He is alert and oriented to person, place, and time.  Psychiatric:        Mood and Affect: Mood normal.      ED Treatments / Results  Labs (all labs ordered are listed, but only abnormal results are displayed) Labs Reviewed  CBC WITH DIFFERENTIAL/PLATELET - Abnormal; Notable for the following components:      Result Value   RBC 4.02 (*)    Hemoglobin 12.0 (*)    HCT 37.9 (*)     All other components within normal limits  BASIC METABOLIC PANEL - Abnormal; Notable for the following components:   Glucose, Bld 105 (*)    Calcium 8.6 (*)    Anion gap 3 (*)    All other components within normal limits  NOVEL CORONAVIRUS, NAA (HOSPITAL ORDER, SEND-OUT TO REF LAB)    EKG None  Radiology Dg Chest Port 1 View  Result Date: 10/29/2018 CLINICAL DATA:  Shortness of breath and generalized body aches EXAM: PORTABLE CHEST 1 VIEW COMPARISON:  01/09/2018 FINDINGS: The heart size and mediastinal contours are within normal limits. Both lungs are clear. The visualized skeletal structures are unremarkable.  IMPRESSION: No active disease. Electronically Signed   By: Deatra RobinsonKevin  Herman M.D.   On: 10/29/2018 23:22    Procedures Procedures (including critical care time)  Medications Ordered in ED Medications  ondansetron (ZOFRAN-ODT) disintegrating tablet 4 mg (4 mg Oral Given 10/30/18 0047)     Initial Impression / Assessment and Plan / ED Course  I have reviewed the triage vital signs and the nursing notes.  Pertinent labs & imaging results that were available during my care of the patient were reviewed by me and considered in my medical decision making (see chart for details).        Patient presents with generalized weakness, nausea, vomiting.  He is overall nontoxic-appearing vital signs are reassuring.  Reports fevers at home but is afebrile here.  His exam is benign including abdominal exam.  Chest x-ray reviewed from triage and negative.  Basic lab work obtained without significant metabolic abnormality.  No significant leukocytosis.  COVID testing was sent and is pending.  Recommend self isolation until these results return.  Patient was given a work note.  He was given Zofran and was able to tolerate fluids prior to discharge.  After history, exam, and medical workup I feel the patient has been appropriately medically screened and is safe for discharge home. Pertinent diagnoses  were discussed with the patient. Patient was given return precautions.  Marcus Mora was evaluated in Emergency Department on 10/30/2018 for the symptoms described in the history of present illness. He was evaluated in the context of the global COVID-19 pandemic, which necessitated consideration that the patient might be at risk for infection with the SARS-CoV-2 virus that causes COVID-19. Institutional protocols and algorithms that pertain to the evaluation of patients at risk for COVID-19 are in a state of rapid change based on information released by regulatory bodies including the CDC and federal and state organizations. These policies and algorithms were followed during the patient's care in the ED.   Final Clinical Impressions(s) / ED Diagnoses   Final diagnoses:  Viral illness  Educated About Covid-19 Virus Infection    ED Discharge Orders         Ordered    ondansetron (ZOFRAN ODT) 4 MG disintegrating tablet  Every 8 hours PRN     10/30/18 0157    naproxen (NAPROSYN) 500 MG tablet  2 times daily     10/30/18 0157           Haleigh Desmith, Mayer Maskerourtney F, MD 10/30/18 0202

## 2018-10-30 NOTE — Discharge Instructions (Signed)
You were seen today and likely have a viral illness.  Take medications as prescribed.  You were tested for COVID-19.  You need to self isolate until these results return in the next 2 to 3 days.  If you develop worsening symptoms, worsening shortness of breath, any new or worsening symptoms you should be reevaluated.

## 2019-01-14 ENCOUNTER — Telehealth: Payer: Self-pay

## 2019-01-14 NOTE — Telephone Encounter (Signed)
Pt. Eligibility is 01/14/2019 till 01/14/2020 with [facility]. Faxed over pt. medassist application and other docs on 01/14/2019  -Telly Jawad

## 2019-01-28 ENCOUNTER — Encounter: Payer: Self-pay | Admitting: Physician Assistant

## 2019-01-28 ENCOUNTER — Ambulatory Visit: Payer: Medicaid Other | Admitting: Physician Assistant

## 2019-01-28 ENCOUNTER — Other Ambulatory Visit: Payer: Self-pay

## 2019-01-28 VITALS — BP 150/88 | HR 85 | Temp 98.1°F | Wt 141.6 lb

## 2019-01-28 DIAGNOSIS — Z13220 Encounter for screening for lipoid disorders: Secondary | ICD-10-CM

## 2019-01-28 DIAGNOSIS — Z87820 Personal history of traumatic brain injury: Secondary | ICD-10-CM

## 2019-01-28 DIAGNOSIS — Z7689 Persons encountering health services in other specified circumstances: Secondary | ICD-10-CM

## 2019-01-28 DIAGNOSIS — G40909 Epilepsy, unspecified, not intractable, without status epilepticus: Secondary | ICD-10-CM

## 2019-01-28 DIAGNOSIS — Z2821 Immunization not carried out because of patient refusal: Secondary | ICD-10-CM

## 2019-01-28 DIAGNOSIS — D649 Anemia, unspecified: Secondary | ICD-10-CM

## 2019-01-28 DIAGNOSIS — R030 Elevated blood-pressure reading, without diagnosis of hypertension: Secondary | ICD-10-CM

## 2019-01-28 NOTE — Progress Notes (Signed)
BP (!) 150/88   Pulse 85   Temp 98.1 F (36.7 C)   Wt 141 lb 9.6 oz (64.2 kg)   SpO2 99%   BMI 19.75 kg/m    Subjective:    Patient ID: Marcus Mora, male    DOB: 02-01-1986, 33 y.o.   MRN: [id number]  HPI: Marcus Mora is a 33 y.o. male presenting on 01/28/2019 for New Patient (Initial Visit)   HPI   Patient had negative COVID-19 screening questionnaire.  Patient is a 33 year old male with history of traumatic brain injury in 2015.  He says it occurred when he fell and hit his head on concrete while at work.  Patient did not pursue having his employer pay for his medical expenses at the time.   Patient reports no history of treatment for hypertension.  Patient is currently getting treatment at [facility] and [facility].   Pt used to have PCP in [place].  He was last seen there in March.  He says he can no longer continue to go there as he lost his insurance.  When asked why he drove all the way from [place] to [place] for treatment, patient says that he drove all the way to [place] from [place] because they would give him suboxone.   He lost his job in august.  He was working at a [employer] as a Location manager.    Patient reports history of seizures which started after his head injury.  He thinks his last seizure was about 4 or 5 months ago.  He states he was on Keppra in the past but has been out of it for at least 6 months and likely longer.   Patient denies any problems today.  He says he just needs to get established to start getting care.  Relevant past medical, surgical, family and social history reviewed and updated as indicated. Interim medical history since our last visit reviewed. Allergies and medications reviewed and updated.    Current Outpatient Medications:  .  ibuprofen (ADVIL,MOTRIN) 200 MG tablet, Take 200 mg by mouth every 6 (six) hours as needed for mild pain or moderate pain., Disp: , Rfl:    Review of Systems  Per HPI unless specifically  indicated above     Objective:    BP (!) 150/88   Pulse 85   Temp 98.1 F (36.7 C)   Wt 141 lb 9.6 oz (64.2 kg)   SpO2 99%   BMI 19.75 kg/m   Wt Readings from Last 3 Encounters:  01/28/19 141 lb 9.6 oz (64.2 kg)  10/29/18 150 lb (68 kg)  05/25/18 138 lb (62.6 kg)    Physical Exam Vitals signs reviewed.  Constitutional:      Appearance: He is well-developed.  HENT:     Head: Normocephalic and atraumatic.  Eyes:     Conjunctiva/sclera: Conjunctivae normal.     Pupils: Pupils are equal, round, and reactive to light.  Neck:     Musculoskeletal: Neck supple.     Thyroid: No thyromegaly.  Cardiovascular:     Rate and Rhythm: Normal rate and regular rhythm.  Pulmonary:     Effort: Pulmonary effort is normal.     Breath sounds: Normal breath sounds. No wheezing or rales.  Abdominal:     General: Bowel sounds are normal.     Palpations: Abdomen is soft. There is no mass.     Tenderness: There is no abdominal tenderness.  Musculoskeletal:     Right lower  leg: No edema.     Left lower leg: No edema.  Lymphadenopathy:     Cervical: No cervical adenopathy.  Skin:    General: Skin is warm and dry.     Findings: No rash.  Neurological:     Mental Status: He is alert and oriented to person, place, and time.  Psychiatric:        Attention and Perception: Attention normal.        Speech: Speech normal.        Behavior: Behavior normal. Behavior is cooperative.         Assessment & Plan:    Encounter Diagnoses  Name Primary?  . Encounter to establish care Yes  . Screening cholesterol level   . Anemia, unspecified type   . History of traumatic brain injury   . Seizure disorder ([place])   . Elevated blood pressure reading   . Influenza vaccination declined        1.  Elevated blood pressure  In light of previous blood pressure readings being in the normal range, will continue to monitor the blood pressure and not put patient on medication for this today.   2.   Seizure disorder following traumatic brain injury   Will refer to neurologist for management of seizure disorder in setting of history of traumatic brain injury.  Patient is reminded that per [facility], he should not drive until he has been seizure-free for at least 6 months.  Patient is given application for [facility] [employer] [facility] financial assistance.   3.  Mental health issues  Patient to continue with [facility] and [facility] has needed    4.  Healthcare maintenance.  We will check baseline labs.  Patient declined influenza immunization.  Patient is encouraged to wear a mask when in public to reduce risk of COVID-19.     Patient will follow up in 1 month.  Will review labs and recheck blood pressure at that time.

## 2019-02-27 ENCOUNTER — Ambulatory Visit: Payer: Medicaid Other | Admitting: Physician Assistant

## 2019-04-10 ENCOUNTER — Other Ambulatory Visit: Payer: Self-pay

## 2019-04-10 ENCOUNTER — Ambulatory Visit: Payer: Self-pay | Admitting: Diagnostic Neuroimaging

## 2019-04-11 ENCOUNTER — Encounter: Payer: Self-pay | Admitting: Diagnostic Neuroimaging

## 2019-04-12 ENCOUNTER — Other Ambulatory Visit: Payer: Self-pay

## 2019-04-12 ENCOUNTER — Emergency Department (HOSPITAL_COMMUNITY): Payer: Self-pay

## 2019-04-12 ENCOUNTER — Emergency Department (HOSPITAL_COMMUNITY)
Admission: EM | Admit: 2019-04-12 | Discharge: 2019-04-12 | Disposition: A | Discharge status: Home / Self Care | Payer: Self-pay | Attending: Emergency Medicine | Admitting: Emergency Medicine

## 2019-04-12 ENCOUNTER — Encounter (HOSPITAL_COMMUNITY): Payer: Self-pay | Admitting: Emergency Medicine

## 2019-04-12 DIAGNOSIS — Z79899 Other long term (current) drug therapy: Secondary | ICD-10-CM | POA: Insufficient documentation

## 2019-04-12 DIAGNOSIS — Z87820 Personal history of traumatic brain injury: Secondary | ICD-10-CM | POA: Insufficient documentation

## 2019-04-12 DIAGNOSIS — R110 Nausea: Secondary | ICD-10-CM | POA: Insufficient documentation

## 2019-04-12 DIAGNOSIS — R519 Headache, unspecified: Secondary | ICD-10-CM | POA: Insufficient documentation

## 2019-04-12 MED ORDER — PROMETHAZINE HCL 25 MG/ML IJ SOLN
25.0000 mg | Freq: Once | INTRAMUSCULAR | Status: AC
  Administered 2019-04-12: 09:00:00 25 mg via INTRAMUSCULAR
  Filled 2019-04-12: qty 1

## 2019-04-12 MED ORDER — KETOROLAC TROMETHAMINE 60 MG/2ML IM SOLN
60.0000 mg | Freq: Once | INTRAMUSCULAR | Status: AC
  Administered 2019-04-12: 60 mg via INTRAMUSCULAR
  Filled 2019-04-12: qty 2

## 2019-04-12 NOTE — Discharge Instructions (Addendum)
Tylenol 1000 mg rotated with ibuprofen 600 mg every 4 hours as needed for pain.  Follow-up with neurology as needed if not improving.

## 2019-04-12 NOTE — ED Triage Notes (Signed)
C/o headache for about a week.  History of head injury 2015.  Was scheduled to see Neurologist last week in [place] but did not have the money to see MD.  Rates pain 8/10.  Vomited twice this am.

## 2019-04-12 NOTE — ED Provider Notes (Signed)
[facility] EMERGENCY DEPARTMENT Provider Note   CSN: [id number] Arrival date & time: 04/12/19  0757     History Chief Complaint  Patient presents with  . Headache    Marcus Mora is a 34 y.o. male.  Patient is a 34 year old male with past medical history of traumatic brain injury.  He apparently required surgery plate placed in his head.  He presents today with complaints of headache and nausea.  This is worsened over the past week.  He has been taking ibuprofen with little relief.  He denies any fevers, chills, or stiff neck.  He denies any new injury or trauma.  He does report occasional blurry vision, but denies any numbness or weakness.  He was to see a neurologist this week, but does not have the finances for the co-pay and he says they would not see him.  The history is provided by the patient.  Headache Pain location:  L parietal Quality:  Dull Radiates to:  Does not radiate Duration:  1 week Timing:  Constant Progression:  Worsening Chronicity:  New Relieved by:  Nothing Worsened by:  Activity and light Ineffective treatments:  NSAIDs Associated symptoms: no fatigue, no fever and no neck stiffness        Past Medical History:  Diagnosis Date  . Anxiety   . Asthma    childhood asthma  . Depression   . Epidural hematoma (HCC) 03/04/14  . GERD (gastroesophageal reflux disease)   . Head injury due to trauma 03/04/2014   from falling and hitting his head  . Headache    constant since head injury 03/04/2014  . History of electroencephalogram 02/2014   normal  . Laceration of knee, left, complicated, initial encounter 10/24/2017  . Temporal bone fracture (HCC) 03/04/14   Right    Patient Active Problem List   Diagnosis Date Noted  . Laceration of knee, left, complicated, initial encounter, traumatic arthrotomy 10/24/2017  . Post-traumatic headache 07/23/2014  . 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 BPPV (benign paroxysmal positional vertigo) 07/23/2014  .  Traumatic epidural hematoma [facility])     Past Surgical History:  Procedure Laterality Date  . CRANIOTOMY N/A 03/04/2014   Procedure: CRANIOTOMY HEMATOMA EVACUATION EPIDURAL;  Surgeon: Temple Pacini, MD;  Location: [facility];  Service: Neurosurgery;  Laterality: N/A;  . hernia    . HERNIA REPAIR  2008  . KNEE ARTHROSCOPY Left 10/24/2017   Procedure: LEFT KNEE ARTHROSCOPIC IRRIGATION AND DEBRIDEMENT;  Surgeon: Teryl Lucy, MD;  Location: [facility];  Service: Orthopedics;  Laterality: Left;  . WISDOM TOOTH EXTRACTION Bilateral        Family History  Problem Relation Age of Onset  . Addison's disease Mother   . Heart disease Father   . Cancer Maternal Grandmother        breast cancer  . Cancer Maternal Grandfather        prostate cancer  . Alzheimer's disease Paternal Grandmother   . Heart attack Paternal Grandfather     Social History   Tobacco Use  . Smoking status: Never Smoker  . Smokeless tobacco: Never Used  Substance Use Topics  . Alcohol use: No    Comment: denies use 03/12/12  . Drug use: Yes    Types: Marijuana    Home Medications Prior to Admission medications   Medication Sig Start Date End Date Taking? Authorizing Provider  ibuprofen (ADVIL,MOTRIN) 200 MG tablet Take 200 mg by mouth every 6 (six) hours as needed for  mild pain or moderate pain.    [provider]    Allergies    Patient has no known allergies.  Review of Systems   Review of Systems  Constitutional: Negative for fatigue and fever.  Musculoskeletal: Negative for neck stiffness.  Neurological: Positive for headaches.  All other systems reviewed and are negative.   Physical Exam Updated Vital Signs BP 130/84 (BP Location: Left Arm)   Pulse 79   Temp 98.1 F (36.7 C) (Oral)   Resp 16   Ht 5\' 10"  (1.778 m)   Wt 65.8 kg   SpO2 100%   BMI 20.81 kg/m   Physical Exam Vitals and nursing note reviewed.  Constitutional:      General: He is not in acute  distress.    Appearance: He is well-developed. He is not diaphoretic.  HENT:     Head: Normocephalic and atraumatic.  Eyes:     Extraocular Movements: Extraocular movements intact.     Pupils: Pupils are equal, round, and reactive to light.  Cardiovascular:     Rate and Rhythm: Normal rate and regular rhythm.     Heart sounds: No murmur. No friction rub.  Pulmonary:     Effort: Pulmonary effort is normal. No respiratory distress.     Breath sounds: Normal breath sounds. No wheezing or rales.  Abdominal:     General: Bowel sounds are normal. There is no distension.     Palpations: Abdomen is soft.     Tenderness: There is no abdominal tenderness.  Musculoskeletal:        General: Normal range of motion.     Cervical back: Normal range of motion and neck supple.  Skin:    General: Skin is warm and dry.  Neurological:     Mental Status: He is alert and oriented to person, place, and time.     Cranial Nerves: No cranial nerve deficit.     Motor: No weakness.     Coordination: Coordination normal.     ED Results / Procedures / Treatments   Labs (all labs ordered are listed, but only abnormal results are displayed) Labs Reviewed - No data to display  EKG None  Radiology No results found.  Procedures Procedures (including critical care time)  Medications Ordered in ED Medications  ketorolac (TORADOL) injection 60 mg (60 mg Intramuscular Given 04/12/19 0901)  promethazine (PHENERGAN) injection 25 mg (25 mg Intramuscular Given 04/12/19 0901)    ED Course  I have reviewed the triage vital signs and the nursing notes.  Pertinent labs & imaging results that were available during my care of the patient were reviewed by me and considered in my medical decision making (see chart for details).    MDM Rules/Calculators/A&P  Patient presenting here with complaints of headache that has been ongoing for the past week.  Patient is neurologically intact and CT scan is negative.  He  has no fever and has full range of motion of his neck.  I see no reason for LP or further evaluation at this time.  Patient will be discharged with rotating Tylenol/ibuprofen.   Final Clinical Impression(s) / ED Diagnoses Final diagnoses:  None    Rx / DC Orders ED Discharge Orders    None       Veryl Speak, MD 04/12/19 1032

## 2019-05-20 ENCOUNTER — Other Ambulatory Visit: Payer: Self-pay

## 2019-05-20 ENCOUNTER — Ambulatory Visit: Payer: Self-pay | Admitting: Diagnostic Neuroimaging

## 2019-05-20 ENCOUNTER — Encounter: Payer: Self-pay | Admitting: Diagnostic Neuroimaging

## 2019-05-20 VITALS — BP 146/82 | HR 76 | Temp 97.5°F | Ht 70.0 in | Wt 146.8 lb

## 2019-05-20 DIAGNOSIS — G43109 Migraine with aura, not intractable, without status migrainosus: Secondary | ICD-10-CM

## 2019-05-20 DIAGNOSIS — R561 Post traumatic seizures: Secondary | ICD-10-CM

## 2019-05-20 MED ORDER — DIVALPROEX SODIUM 500 MG PO DR TAB: 500.0000 mg | DELAYED_RELEASE_TABLET | Freq: Two times a day (BID) | ORAL | 12 refills | Status: DC

## 2019-05-20 MED ORDER — RIZATRIPTAN BENZOATE 10 MG PO TBDP: 10.0000 mg | ORAL_TABLET | ORAL | 11 refills | Status: DC | PRN

## 2019-05-20 NOTE — Progress Notes (Signed)
[facility]  PATIENT: Marcus Mora DOB: May 14, 1985  REFERRING CLINICIAN: Soyla Dryer, PA-C HISTORY FROM: patient  REASON FOR VISIT: new consult    HISTORICAL  CHIEF COMPLAINT:  Chief Complaint  Patient presents with  . Seizures    rm 7 New Pt, sig other- Carmen,  Seizures began after head trauma 2015; Keppra was helping a little but can't afford it- no insurance, applied for Medicaid"    HISTORY OF PRESENT ILLNESS:   34 year old male here for evaluation of seizure disorder.  2015 patient was using a leaf blower, felt hot, sweaty, lightheaded and passed out.  He fell down and struck his head.  He had a seizure and was taken to the hospital.  He was diagnosed with epidural hematoma and skull fracture.  Patient was treated with surgery.  He was also started on antiseizure medication Keppra 500 mg twice a day.  He was on and off of this medication.  He had breakthrough seizures. Patient had a seizure in 2017 and ended up in the emergency room. Patient has been having seizures every 2 to 3 months.  Last seizure was in July 2020.  Currently not on any antiseizure medication.  Patient also been having increasing headaches since his injury.  He describes right-sided throbbing headaches with nausea, vomiting, sensitive to light and sound.  He is averaging 5 days of headache per week.  He is tried over-the-counter medications without relief.  Had some mild headaches prior to his injury.  Has been having some difficulty with depression and anxiety.  Has been to psychiatrist in the past.  These have worsened since his injury.    REVIEW OF SYSTEMS: Full 14 system review of systems performed and negative with exception of: As per HPI.  ALLERGIES: No Known Allergies  HOME MEDICATIONS: Outpatient Medications Prior to Visit  Medication Sig Dispense Refill  . ibuprofen (ADVIL,MOTRIN) 200 MG tablet Take 200 mg by mouth every 6 (six) hours as needed for mild pain or  moderate pain.     No facility-administered medications prior to visit.    PAST MEDICAL HISTORY: Past Medical History:  Diagnosis Date  . Anxiety   . Asthma    childhood asthma  . Depression   . Epidural hematoma ([place]) 03/04/14  . GERD (gastroesophageal reflux disease)   . Head injury due to trauma 03/04/2014   from falling and hitting his head  . Headache    constant since head injury 03/04/2014  . History of electroencephalogram 02/2014   normal  . Laceration of knee, left, complicated, initial encounter 10/24/2017  . Seizures ([place])    since 2015, 04/2019 last seizures6-7 months ago  . Temporal bone fracture ([place]) 03/04/14   Right    PAST SURGICAL HISTORY: Past Surgical History:  Procedure Laterality Date  . CRANIOTOMY N/A 03/04/2014   Procedure: CRANIOTOMY HEMATOMA EVACUATION EPIDURAL;  Surgeon: Charlie Pitter, MD;  Location: [place] [facility];  Service: Neurosurgery;  Laterality: N/A;  . HERNIA REPAIR  2008   inguinal  . KNEE ARTHROSCOPY Left 10/24/2017   Procedure: LEFT KNEE ARTHROSCOPIC IRRIGATION AND DEBRIDEMENT;  Surgeon: Marchia Bond, MD;  Location: [place];  Service: Orthopedics;  Laterality: Left;  . WISDOM TOOTH EXTRACTION Bilateral     FAMILY HISTORY: Family History  Problem Relation Age of Onset  . Addison's disease Mother   . Heart disease Father   . Cancer Maternal Grandmother        breast cancer  . Cancer Maternal  Grandfather        prostate cancer  . Alzheimer's disease Paternal Grandmother   . Heart attack Paternal Grandfather     SOCIAL HISTORY: Social History   Socioeconomic History  . Marital status: Single    Spouse name: Not on file  . Number of children: 2  . Years of education: Not on file  . Highest education level: High school graduate  Occupational History    Comment: NA  Tobacco Use  . Smoking status: Never Smoker  . Smokeless tobacco: Never Used  Substance and Sexual Activity  . Alcohol use: No    Comment:  denies use 03/12/12  . Drug use: Yes    Types: Marijuana    Comment: 05/20/19 periodically for anxiety, sleep  . Sexual activity: Yes    Birth control/protection: None  Other Topics Concern  . Not on file  Social History Narrative   Lives with sig other   No caffeine   Social Determinants of Health   Financial Resource Strain:   . Difficulty of Paying Living Expenses: Not on file  Food Insecurity:   . Worried About Programme researcher, broadcasting/film/video in the Last Year: Not on file  . Ran Out of Food in the Last Year: Not on file  Transportation Needs:   . Lack of Transportation (Medical): Not on file  . Lack of Transportation (Non-Medical): Not on file  Physical Activity:   . Days of Exercise per Week: Not on file  . Minutes of Exercise per Session: Not on file  Stress:   . Feeling of Stress : Not on file  Social Connections:   . Frequency of Communication with Friends and Family: Not on file  . Frequency of Social Gatherings with Friends and Family: Not on file  . Attends Religious Services: Not on file  . Active Member of Clubs or Organizations: Not on file  . Attends Banker Meetings: Not on file  . Marital Status: Not on file  Intimate Partner Violence:   . Fear of Current or Ex-Partner: Not on file  . Emotionally Abused: Not on file  . Physically Abused: Not on file  . Sexually Abused: Not on file     PHYSICAL EXAM  GENERAL EXAM/CONSTITUTIONAL: Vitals:  Vitals:   05/20/19 0953  BP: (!) 146/82  Pulse: 76  Temp: (!) 97.5 F (36.4 C)  Weight: 146 lb 12.8 oz (66.6 kg)  Height: 5\' 10"  (1.778 m)     Body mass index is 21.06 kg/m. Wt Readings from Last 3 Encounters:  05/20/19 146 lb 12.8 oz (66.6 kg)  04/12/19 145 lb (65.8 kg)  01/28/19 141 lb 9.6 oz (64.2 kg)     Patient is in no distress; well developed, nourished and groomed; neck is supple  CARDIOVASCULAR:  Examination of carotid arteries is normal; no carotid bruits  Regular rate and rhythm, no  murmurs  Examination of peripheral vascular system by observation and palpation is normal  EYES:  Ophthalmoscopic exam of optic discs and posterior segments is normal; no papilledema or hemorrhages  No exam data present  MUSCULOSKELETAL:  Gait, strength, tone, movements noted in Neurologic exam below  NEUROLOGIC: MENTAL STATUS:  No flowsheet data found.  awake, alert, oriented to person, place and time  recent and remote memory intact  normal attention and concentration  language fluent, comprehension intact, naming intact  fund of knowledge appropriate  CRANIAL NERVE:   2nd - no papilledema on fundoscopic exam  2nd, 3rd, 4th, 6th -  pupils equal and reactive to light, visual fields full to confrontation, extraocular muscles intact, no nystagmus  5th - facial sensation symmetric  7th - facial strength symmetric  8th - hearing intact  9th - palate elevates symmetrically, uvula midline  11th - shoulder shrug symmetric  12th - tongue protrusion midline  MOTOR:   normal bulk and tone, full strength in the BUE, BLE  SENSORY:   normal and symmetric to light touch, temperature, vibration  COORDINATION:   finger-nose-finger, fine finger movements normal  REFLEXES:   deep tendon reflexes present and symmetric  GAIT/STATION:   narrow based gait     DIAGNOSTIC DATA (LABS, IMAGING, TESTING) - I reviewed patient records, labs, notes, testing and imaging myself where available.  Lab Results  Component Value Date   WBC 4.8 10/30/2018   HGB 12.0 (L) 10/30/2018   HCT 37.9 (L) 10/30/2018   MCV 94.3 10/30/2018   PLT 161 10/30/2018      Component Value Date/Time   NA 140 10/30/2018 0114   K 4.2 10/30/2018 0114   CL 107 10/30/2018 0114   CO2 30 10/30/2018 0114   GLUCOSE 105 (H) 10/30/2018 0114   BUN 15 10/30/2018 0114   CREATININE 0.86 10/30/2018 0114   CALCIUM 8.6 (L) 10/30/2018 0114   PROT 7.3 01/01/2010 2245   ALBUMIN 4.4 01/01/2010 2245   AST  26 01/01/2010 2245   ALT 37 01/01/2010 2245   ALKPHOS 55 01/01/2010 2245   BILITOT 0.4 01/01/2010 2245   GFRNONAA >60 10/30/2018 0114   GFRAA >60 10/30/2018 0114   No results found for: CHOL, HDL, LDLCALC, LDLDIRECT, TRIG, CHOLHDL No results found for: IDPO2U No results found for: VITAMINB12 No results found for: TSH   04/12/19 CT head  1. Evidence of previous right temporal craniotomy. Brain parenchyma appears unremarkable. No mass or hemorrhage. 2.  Multifocal paranasal sinus disease.     ASSESSMENT AND PLAN  34 y.o. year old male here with syncope in 2015, with posttraumatic seizures.  Dx:  1. Post traumatic seizure disorder (HCC)   2. Migraine with aura and without status migrainosus, not intractable     PLAN:   SEIZURE DISORDER (post-traumatic; since 2015; last seizure July 2020) - start divalproex 500mg  twice a day (can help with seizures, mood stabilization, headaches) - check CBC, CMP  HEADACHES (migraine with aura) - start divalproex - rizatriptan as needed   Orders Placed This Encounter  Procedures  . CBC with Differential/Platelet  . Comprehensive metabolic panel    Meds ordered this encounter  Medications  . divalproex (DEPAKOTE) 500 MG DR tablet    Sig: Take 1 tablet (500 mg total) by mouth 2 (two) times daily.    Dispense:  60 tablet    Refill:  12  . rizatriptan (MAXALT-MLT) 10 MG disintegrating tablet    Sig: Take 1 tablet (10 mg total) by mouth as needed for migraine. May repeat in 2 hours if needed    Dispense:  9 tablet    Refill:  11   Return in about 6 months (around 11/17/2019).    11/19/2019, MD 05/20/2019, 10:30 AM Certified in Neurology, Neurophysiology and Neuroimaging  [facility] [address] [place], [place] [phone]

## 2019-05-20 NOTE — Patient Instructions (Signed)
SEIZURE DISORDER (post-traumatic; since 2015; last seizure July 2020) - start divalproex 500mg  twice a day (can help with seizures, mood stabilization, headaches) - check CBC, CMP  HEADACHES (migraine with aura) - start divalproex for migraine prevention as above - MIGRAINE RESCUE --> rizatriptan 10mg  as needed for breakthrough headache; may repeat x 1 after 2 hours; max 2 tabs per day or 8 per month

## 2019-08-10 ENCOUNTER — Encounter (HOSPITAL_COMMUNITY): Payer: Self-pay | Admitting: Emergency Medicine

## 2019-08-10 ENCOUNTER — Other Ambulatory Visit: Payer: Self-pay

## 2019-08-10 ENCOUNTER — Emergency Department (HOSPITAL_COMMUNITY)
Admission: EM | Admit: 2019-08-10 | Discharge: 2019-08-10 | Disposition: A | Discharge status: Home / Self Care | Payer: Medicaid Other | Attending: Emergency Medicine | Admitting: Emergency Medicine

## 2019-08-10 DIAGNOSIS — K029 Dental caries, unspecified: Secondary | ICD-10-CM

## 2019-08-10 DIAGNOSIS — Z79899 Other long term (current) drug therapy: Secondary | ICD-10-CM | POA: Insufficient documentation

## 2019-08-10 MED ORDER — NAPROXEN 500 MG PO TABS: 500.0000 mg | ORAL_TABLET | Freq: Two times a day (BID) | ORAL | 0 refills | Status: DC

## 2019-08-10 MED ORDER — AMOXICILLIN 500 MG PO CAPS: 500.0000 mg | ORAL_CAPSULE | Freq: Two times a day (BID) | ORAL | 0 refills | Status: AC

## 2019-08-10 MED ORDER — AMOXICILLIN 500 MG PO CAPS: 500.0000 mg | ORAL_CAPSULE | Freq: Two times a day (BID) | ORAL | 0 refills | Status: DC
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
## 2019-08-10 NOTE — ED Triage Notes (Signed)
Patient c/o right lower dental pain that started yesterday after tooth broke. Denies any fevers. Per patient jaw and pain. Patient taking tylenol and ibuprofen with no relief. Patient last had ibuprofen 2 hours ago.

## 2019-08-10 NOTE — Discharge Instructions (Signed)

## 2019-10-17 ENCOUNTER — Emergency Department (HOSPITAL_COMMUNITY)
Admission: EM | Admit: 2019-10-17 | Discharge: 2019-10-18 | Discharge status: Against Medical Advice | Payer: Medicaid Other

## 2019-10-17 ENCOUNTER — Other Ambulatory Visit: Payer: Self-pay

## 2019-11-19 ENCOUNTER — Ambulatory Visit: Payer: Self-pay | Admitting: Diagnostic Neuroimaging

## 2019-11-19 ENCOUNTER — Encounter: Payer: Self-pay | Admitting: Diagnostic Neuroimaging

## 2019-11-19 ENCOUNTER — Telehealth: Payer: Self-pay | Admitting: *Deleted

## 2019-11-19 NOTE — Telephone Encounter (Signed)
Patient was no show for follow up today. 

## 2020-02-28 ENCOUNTER — Other Ambulatory Visit: Payer: Self-pay

## 2020-02-28 ENCOUNTER — Emergency Department (HOSPITAL_COMMUNITY): Payer: Medicaid Other

## 2020-02-28 ENCOUNTER — Emergency Department (HOSPITAL_COMMUNITY)
Admission: EM | Admit: 2020-02-28 | Discharge: 2020-02-28 | Disposition: A | Discharge status: Home / Self Care | Payer: Medicaid Other | Attending: Emergency Medicine | Admitting: Emergency Medicine

## 2020-02-28 DIAGNOSIS — R569 Unspecified convulsions: Secondary | ICD-10-CM | POA: Diagnosis not present

## 2020-02-28 DIAGNOSIS — F419 Anxiety disorder, unspecified: Secondary | ICD-10-CM | POA: Diagnosis not present

## 2020-02-28 DIAGNOSIS — J45909 Unspecified asthma, uncomplicated: Secondary | ICD-10-CM | POA: Diagnosis not present

## 2020-02-28 DIAGNOSIS — Z79899 Other long term (current) drug therapy: Secondary | ICD-10-CM | POA: Insufficient documentation

## 2020-02-28 LAB — BASIC METABOLIC PANEL
Anion gap: 8 (ref 5–15)
BUN: 8 mg/dL (ref 6–20)
CO2: 24 mmol/L (ref 22–32)
Calcium: 9 mg/dL (ref 8.9–10.3)
Chloride: 106 mmol/L (ref 98–111)
Creatinine, Ser: 0.92 mg/dL (ref 0.61–1.24)
GFR, Estimated: >60 mL/min (ref >60)
Glucose, Bld: 85 mg/dL (ref 70–99)
Potassium: 4.3 mmol/L (ref 3.5–5.1)
Sodium: 138 mmol/L (ref 135–145)

## 2020-02-28 LAB — CBC
HCT: 37.8 % — ABNORMAL LOW (ref 39.0–52.0)
Hemoglobin: 12.7 g/dL — ABNORMAL LOW (ref 13.0–17.0)
MCH: 30.5 pg (ref 26.0–34.0)
MCHC: 33.6 g/dL (ref 30.0–36.0)
MCV: 90.9 fL (ref 80.0–100.0)
Platelets: 195 10*3/uL (ref 150–400)
RBC: 4.16 MIL/uL — ABNORMAL LOW (ref 4.22–5.81)
RDW: 12.1 % (ref 11.5–15.5)
WBC: 10.7 10*3/uL — ABNORMAL HIGH (ref 4.0–10.5)
nRBC: 0 % (ref 0.0–0.2)

## 2020-02-28 LAB — CBG MONITORING, ED: Glucose-Capillary: 267 mg/dL — ABNORMAL HIGH (ref 70–99)

## 2020-02-28 MED ORDER — LEVETIRACETAM IN NACL 1500 MG/100ML IV SOLN
1500.0000 mg | Freq: Once | INTRAVENOUS | Status: AC
  Administered 2020-02-28: 1500 mg via INTRAVENOUS
  Filled 2020-02-28: qty 100
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
## 2020-02-28 NOTE — Discharge Instructions (Signed)
Take your Keppra more consistently.  No driving until you are seizure-free for 6 months and neurology clears you.

## 2020-03-17 DIAGNOSIS — Z0289 Encounter for other administrative examinations: Secondary | ICD-10-CM
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
